# Patient Record
Sex: Female | Born: 1960
Health system: Southern US, Community
[De-identification: ages and names within clinical notes are randomized; demographics above are authoritative.]

## PROBLEM LIST (undated history)

## (undated) DIAGNOSIS — N2889 Other specified disorders of kidney and ureter: Secondary | ICD-10-CM

## (undated) DIAGNOSIS — F32A Depression, unspecified: Secondary | ICD-10-CM

## (undated) DIAGNOSIS — F329 Major depressive disorder, single episode, unspecified: Secondary | ICD-10-CM

## (undated) DIAGNOSIS — K7689 Other specified diseases of liver: Secondary | ICD-10-CM

## (undated) DIAGNOSIS — E669 Obesity, unspecified: Secondary | ICD-10-CM

## (undated) DIAGNOSIS — R911 Solitary pulmonary nodule: Secondary | ICD-10-CM

## (undated) DIAGNOSIS — J45909 Unspecified asthma, uncomplicated: Secondary | ICD-10-CM

## (undated) DIAGNOSIS — E279 Disorder of adrenal gland, unspecified: Secondary | ICD-10-CM

## (undated) DIAGNOSIS — G2581 Restless legs syndrome: Secondary | ICD-10-CM

## (undated) DIAGNOSIS — E041 Nontoxic single thyroid nodule: Secondary | ICD-10-CM

## (undated) DIAGNOSIS — E278 Other specified disorders of adrenal gland: Secondary | ICD-10-CM

## (undated) DIAGNOSIS — I10 Essential (primary) hypertension: Secondary | ICD-10-CM

## (undated) DIAGNOSIS — D649 Anemia, unspecified: Secondary | ICD-10-CM

## (undated) HISTORY — PX: ABDOMINAL HYSTERECTOMY: SHX81

## (undated) HISTORY — PX: TUBAL LIGATION: SHX77

## (undated) HISTORY — PX: NEPHRECTOMY: SHX65

## (undated) HISTORY — PX: TOTAL ABDOMINAL HYSTERECTOMY: SHX209

## (undated) HISTORY — DX: Nontoxic single thyroid nodule: E04.1

## (undated) HISTORY — DX: Other specified diseases of liver: K76.89

---

## 2001-12-18 ENCOUNTER — Ambulatory Visit (HOSPITAL_COMMUNITY): Admission: RE | Admit: 2001-12-18 | Discharge: 2001-12-18 | Payer: Self-pay | Admitting: Orthopaedic Surgery

## 2001-12-18 ENCOUNTER — Encounter: Payer: Self-pay | Admitting: Orthopaedic Surgery

## 2003-03-22 ENCOUNTER — Ambulatory Visit (HOSPITAL_COMMUNITY): Admission: RE | Admit: 2003-03-22 | Discharge: 2003-03-22 | Payer: Self-pay | Admitting: Family Medicine

## 2003-03-22 ENCOUNTER — Encounter: Payer: Self-pay | Admitting: Family Medicine

## 2003-04-11 ENCOUNTER — Ambulatory Visit (HOSPITAL_COMMUNITY): Admission: RE | Admit: 2003-04-11 | Discharge: 2003-04-11 | Payer: Self-pay | Admitting: Family Medicine

## 2003-04-11 ENCOUNTER — Encounter: Payer: Self-pay | Admitting: Family Medicine

## 2012-12-23 DIAGNOSIS — R079 Chest pain, unspecified: Secondary | ICD-10-CM

## 2015-01-11 ENCOUNTER — Emergency Department (HOSPITAL_COMMUNITY): Payer: BLUE CROSS/BLUE SHIELD

## 2015-01-11 ENCOUNTER — Emergency Department (HOSPITAL_COMMUNITY)
Admission: EM | Admit: 2015-01-11 | Discharge: 2015-01-11 | Disposition: A | Discharge status: Home / Self Care | Payer: BLUE CROSS/BLUE SHIELD | Attending: Emergency Medicine | Admitting: Emergency Medicine

## 2015-01-11 ENCOUNTER — Encounter (HOSPITAL_COMMUNITY): Payer: Self-pay | Admitting: Family Medicine

## 2015-01-11 DIAGNOSIS — R42 Dizziness and giddiness: Secondary | ICD-10-CM | POA: Insufficient documentation

## 2015-01-11 DIAGNOSIS — M549 Dorsalgia, unspecified: Secondary | ICD-10-CM | POA: Diagnosis not present

## 2015-01-11 DIAGNOSIS — Z880 Allergy status to penicillin: Secondary | ICD-10-CM | POA: Insufficient documentation

## 2015-01-11 DIAGNOSIS — R200 Anesthesia of skin: Secondary | ICD-10-CM | POA: Diagnosis not present

## 2015-01-11 DIAGNOSIS — R079 Chest pain, unspecified: Secondary | ICD-10-CM | POA: Diagnosis present

## 2015-01-11 DIAGNOSIS — I10 Essential (primary) hypertension: Secondary | ICD-10-CM | POA: Diagnosis not present

## 2015-01-11 DIAGNOSIS — M542 Cervicalgia: Secondary | ICD-10-CM | POA: Insufficient documentation

## 2015-01-11 DIAGNOSIS — R0602 Shortness of breath: Secondary | ICD-10-CM | POA: Diagnosis not present

## 2015-01-11 DIAGNOSIS — Z79899 Other long term (current) drug therapy: Secondary | ICD-10-CM | POA: Diagnosis not present

## 2015-01-11 DIAGNOSIS — R2243 Localized swelling, mass and lump, lower limb, bilateral: Secondary | ICD-10-CM | POA: Diagnosis not present

## 2015-01-11 HISTORY — DX: Essential (primary) hypertension: I10

## 2015-01-11 LAB — BASIC METABOLIC PANEL
Anion gap: 8 (ref 5–15)
BUN: 9 mg/dL (ref 6–20)
CO2: 26 mmol/L (ref 22–32)
Calcium: 9.5 mg/dL (ref 8.9–10.3)
Chloride: 106 mmol/L (ref 101–111)
Creatinine, Ser: 0.63 mg/dL (ref 0.44–1.00)
GFR calc non Af Amer: >60 mL/min (ref >60)
GLUCOSE: 98 mg/dL (ref 65–99)
Potassium: 4.4 mmol/L (ref 3.5–5.1)
SODIUM: 140 mmol/L (ref 135–145)

## 2015-01-11 LAB — CBC
HEMATOCRIT: 42.2 % (ref 36.0–46.0)
Hemoglobin: 13.7 g/dL (ref 12.0–15.0)
MCH: 27.4 pg (ref 26.0–34.0)
MCHC: 32.5 g/dL (ref 30.0–36.0)
MCV: 84.4 fL (ref 78.0–100.0)
Platelets: 307 10*3/uL (ref 150–400)
RBC: 5 MIL/uL (ref 3.87–5.11)
RDW: 14 % (ref 11.5–15.5)
WBC: 9.8 10*3/uL (ref 4.0–10.5)

## 2015-01-11 LAB — I-STAT TROPONIN, ED: Troponin i, poc: 0 ng/mL (ref 0.00–0.08)

## 2015-01-11 LAB — BRAIN NATRIURETIC PEPTIDE: B NATRIURETIC PEPTIDE 5: 7.3 pg/mL (ref 0.0–100.0)

## 2015-01-11 MED ORDER — GI COCKTAIL ~~LOC~~
30.0000 mL | Freq: Once | ORAL | Status: AC
  Administered 2015-01-11: 30 mL via ORAL
  Filled 2015-01-11: qty 30

## 2015-01-11 MED ORDER — NAPROXEN 250 MG PO TABS: 250.0000 mg | ORAL_TABLET | Freq: Two times a day (BID) | ORAL | Status: DC

## 2015-01-11 MED ORDER — OXYCODONE-ACETAMINOPHEN 5-325 MG PO TABS
1.0000 | ORAL_TABLET | Freq: Once | ORAL | Status: AC
  Administered 2015-01-11: 1 via ORAL
  Filled 2015-01-11: qty 1

## 2015-01-11 MED ORDER — OMEPRAZOLE 20 MG PO CPDR: 20.0000 mg | DELAYED_RELEASE_CAPSULE | Freq: Every day | ORAL | Status: DC

## 2015-01-11 MED ORDER — PREDNISONE 10 MG PO TABS: 10.0000 mg | ORAL_TABLET | Freq: Every day | ORAL | Status: DC

## 2015-01-11 NOTE — ED Notes (Addendum)
Pt was admitted to [facility] Monday after a syncopal episode, discharged yesterday. States is still feeling bad. Woke up this am with numbness in both hands, chest and back pain, left sided, radiates to back and neck. 8/10-- dull pain, "hard to explain-- feels like heart is in throat when it's beating"  Pt did have CT at [facility]

## 2015-01-11 NOTE — ED Provider Notes (Signed)
54 year old female, history of hypertension, admitted to the hospital several days ago for syncope, was having chest pain as well, states that she had a normal CT scan of the brain, brings her paperwork with her which shows that she had normal blood work showing no signs of myocardial infarction. 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 I am in agreement with this plan.  ED ECG REPORT  I personally interpreted this EKG   Date: 01/11/2015   Rate: 96  Rhythm: normal sinus rhythm  QRS Axis: normal  Intervals: normal  ST/T Wave abnormalities: normal  Conduction Disutrbances:none  Narrative Interpretation:   Old EKG Reviewed: none available   Medical screening examination/treatment/procedure(s) were conducted as a shared visit with non-physician practitioner(s) and myself.  I personally evaluated the patient during the encounter.  Clinical Impression:   Final diagnoses:  Chest pain, unspecified chest pain type          Noemi Chapel, MD 01/11/15 0375

## 2015-01-11 NOTE — ED Notes (Signed)
PA at bedside.

## 2015-01-11 NOTE — ED Provider Notes (Signed)
CSN: [id number]     Arrival date & time 01/11/15  7867 History   First MD Initiated Contact with Patient 01/11/15 0745     Chief Complaint  Patient presents with  . Chest Pain   Jordan Harmon is a 54 y.o. female with a history of hypertension who presents to the ED complaining of chest pain ongoing for the past week, associated with numbness in her bilateral hands, and left back pain. Patient reports she began having substernal and left-sided chest pain 1 week ago and approximately 4 days ago the patient had a syncopal episode after getting up to use the bathroom. Patient went to [facility] where she was admitted and was discharged yesterday. The patient reports she is feeling the same if not worse from before she was admitted. Patient had a cardiac workup and head CT according to the patient. Patient is discharged yesterday and is continuing to have chest pain. Patient currently complains of left-sided chest pain that radiates into her back and to her left neck that she rates it an 8 out of 10 and describes it as dull. She does report some slight shortness of breath associated with this. She denies worsening chest pain on exertion. She does report some intermittent lightheadedness. The patient reports that after her syncopal episode 4 days ago the patient had numbness in her bilateral hands and feet, and that her feet numbness has resolved. She woke up this morning with her bilateral hands feeling numb but this is slowly improving. Patient also reports lots of burping and belching and symptoms of acid reflux. She does not take treatment for acid reflux.  She has not had another syncope since 4 days ago. The patient denies fevers, chills, headache, dizziness, changes to her vision, wheezing, coughing, palpitations, or rashes. The patient reports her mother had a heart attack in her 51s. The patient denies personal or close family history of DVTs or PEs. Patient denies personal or close family history  of blood clotting disorders such as factor V Leiden, protein C or S deficiency. The patient denies endogenous estrogen use, hemoptysis, smoking, or recent long travel. The patient denies recent surgeries.  (Consider location/radiation/quality/duration/timing/severity/associated sxs/prior Treatment) HPI  Past Medical History  Diagnosis Date  . Hypertension    Past Surgical History  Procedure Laterality Date  . Abdominal hysterectomy     History reviewed. No pertinent family history. History  Substance Use Topics  . Smoking status: Never Smoker   . Smokeless tobacco: Not on file  . Alcohol Use: No   OB History    No data available     Review of Systems  Constitutional: Negative for fever and chills.  HENT: Negative for congestion, ear pain and sore throat.   Eyes: Negative for pain and visual disturbance.  Respiratory: Positive for shortness of breath. Negative for cough and wheezing.   Cardiovascular: Positive for chest pain. Negative for palpitations and leg swelling.  Gastrointestinal: Negative for nausea, vomiting, abdominal pain and diarrhea.  Genitourinary: Negative for dysuria, frequency, hematuria and difficulty urinating.  Musculoskeletal: Positive for back pain and neck pain. Negative for neck stiffness.  Skin: Negative for rash.  Neurological: Positive for light-headedness and numbness. Negative for dizziness, syncope, speech difficulty, weakness and headaches.      Allergies  Penicillins and Asa  Home Medications   Prior to Admission medications   Medication Sig Start Date End Date Taking? Authorizing Provider  amLODipine (NORVASC) 10 MG tablet Take 10 mg by mouth daily.  Yes Historical Provider, MD  naproxen (NAPROSYN) 250 MG tablet Take 1 tablet (250 mg total) by mouth 2 (two) times daily with a meal. 01/11/15   Waynetta Pean, PA-C  omeprazole (PRILOSEC) 20 MG capsule Take 1 capsule (20 mg total) by mouth daily. 01/11/15   Waynetta Pean, PA-C  predniSONE  (DELTASONE) 10 MG tablet Take 1 tablet (10 mg total) by mouth daily. 01/11/15   Waynetta Pean, PA-C   BP 137/81 mmHg  Pulse 78  Temp(Src) 98 F (36.7 C) (Oral)  Resp 16  Ht 5\' 7"  (1.702 m)  Wt 243 lb (110.224 kg)  BMI 38.05 kg/m2  SpO2 100% Physical Exam  Constitutional: She is oriented to person, place, and time. She appears well-developed and well-nourished. No distress.  Nontoxic appearing.  HENT:  Head: Normocephalic and atraumatic.  Right Ear: External ear normal.  Left Ear: External ear normal.  Mouth/Throat: Oropharynx is clear and moist. No oropharyngeal exudate.  Eyes: Conjunctivae and EOM are normal. Pupils are equal, round, and reactive to light. Right eye exhibits no discharge. Left eye exhibits no discharge.  Neck: Normal range of motion. Neck supple. No JVD present. No tracheal deviation present.  No midline neck tenderness. Full range of motion of her neck.  Cardiovascular: Normal rate, regular rhythm, normal heart sounds and intact distal pulses.  Exam reveals no gallop and no friction rub.   No murmur heard. Bilateral radial, posterior tibialis and dorsalis pedis pulses are intact.    Pulmonary/Chest: Effort normal and breath sounds normal. No respiratory distress. She has no wheezes. She has no rales. She exhibits tenderness.  Lungs are clear to auscultation bilaterally. Patient has left sided chest wall tenderness to palpation which reproduces her chest pain.  Abdominal: Soft. Bowel sounds are normal. She exhibits no distension. There is no tenderness. There is no guarding.  Musculoskeletal: She exhibits edema. She exhibits no tenderness.  Mild bilateral ankle edema. No calf edema. There is a small area of ecchymosis to her left lateral low back that is tender to palpation.   Lymphadenopathy:    She has no cervical adenopathy.  Neurological: She is alert and oriented to person, place, and time. No cranial nerve deficit. Coordination normal.  Cranial nerves are  intact. No pronator drift. Finger to nose intact bilaterally. Sensation is intact to her bilateral upper and lower extremities. Patient has 5 out of 5 strength in her bilateral upper and lower extremities.  Skin: Skin is warm and dry. No rash noted. She is not diaphoretic. No erythema. No pallor.  Psychiatric: She has a normal mood and affect. Her behavior is normal.  Nursing note and vitals reviewed.   ED Course  Procedures (including critical care time) Labs Review Labs Reviewed  CBC  BASIC METABOLIC PANEL  BRAIN NATRIURETIC PEPTIDE  Randolm Idol, ED    Imaging Review Dg Chest 2 View  01/11/2015   CLINICAL DATA:  Chest pain.  EXAM: CHEST  2 VIEW  COMPARISON:  December 23, 2012.  FINDINGS: The heart size and mediastinal contours are within normal limits. Both lungs are clear. No pneumothorax or pleural effusion is noted. The visualized skeletal structures are unremarkable.  IMPRESSION: No active cardiopulmonary disease.   Electronically Signed   By: Marijo Conception, M.D.   On: 01/11/2015 09:17     EKG Interpretation None      Filed Vitals:   01/11/15 0930 01/11/15 1015 01/11/15 1035 01/11/15 1050  BP:    137/81  Pulse: 80 66 78  Temp:      TempSrc:      Resp: 26 17 16    Height:      Weight:      SpO2: 100% 99% 100%      MDM   Meds given in ED:  Medications  gi cocktail (Maalox,Lidocaine,Donnatal) (30 mLs Oral Given 01/11/15 0829)  oxyCODONE-acetaminophen (PERCOCET/ROXICET) 5-325 MG per tablet 1 tablet (1 tablet Oral Given 01/11/15 1045)    New Prescriptions   NAPROXEN (NAPROSYN) 250 MG TABLET    Take 1 tablet (250 mg total) by mouth 2 (two) times daily with a meal.   OMEPRAZOLE (PRILOSEC) 20 MG CAPSULE    Take 1 capsule (20 mg total) by mouth daily.   PREDNISONE (DELTASONE) 10 MG TABLET    Take 1 tablet (10 mg total) by mouth daily.    Final diagnoses:  Chest pain, unspecified chest pain type   This is a 54 year old female with a history of hypertension who  resents the emergency department complaining of chest pain ongoing for the past 7 days. Patient had a syncopal episode on Monday and was admitted at [facility] for cardiac workup was discharged yesterday. Patient reports she continues to have this chest pain as well as some tingling and numbness in her bilateral hands. Patient had a cardiac workup and a head CT at this time which was unremarkable. Head CT done 2 days ago was unremarkable. On exam the patient is afebrile and nontoxic appearing. The patient's anterior chest wall is tender to palpation reproduces her chest pain. Her vital signs are stable. Her heart is regular rate and rhythm and EKG shows no acute abnormalities. She has no focal neurological deficits. The patient's troponin is negative. Her BNP is within normal limits. CBC and BMP are within normal limits. Chest x-ray is unremarkable. As the patient has had cp for many days now I see no need for second troponin. Patient is yet to have a cardiac stress test. I called cardiology and had them schedule a cardiac stress test as an outpatient for this patient. Patient is in agreement with following up with cardiology. Will start the patient on omeprazole, naproxen and prednisone 10 mg daily for 10 days. Strict return precautions provided. I advised the patient to follow-up with their primary care provider and cardiology this week. I advised the patient to return to the emergency department with new or worsening symptoms or new concerns. The patient verbalized understanding and agreement with plan.    This patient was discussed with and evaluated by Dr. Sabra Heck who agrees with assessment and plan.    Waynetta Pean, PA-C 01/11/15 1120  Noemi Chapel, MD 01/11/15 [phone]

## 2015-01-11 NOTE — ED Notes (Signed)
Pt ambulated to restroom with Rip Harbour, EMT. Pt states that she became dizzy when returning to room. Will, PA aware.

## 2015-01-11 NOTE — ED Notes (Signed)
Per pt having chest pain, back pain and arm numbness x a few weeks. Seen at more head previously.sts SOB.

## 2015-01-11 NOTE — Discharge Instructions (Signed)
Chest Pain (Nonspecific) °It is often hard to give a specific diagnosis for the cause of chest pain. There is always a chance that your pain could be related to something serious, such as a heart attack or a blood clot in the lungs. You need to follow up with your health care provider for further evaluation. °CAUSES  °· Heartburn. °· Pneumonia or bronchitis. °· Anxiety or stress. °· Inflammation around your heart (pericarditis) or lung (pleuritis or pleurisy). °· A blood clot in the lung. °· A collapsed lung (pneumothorax). It can develop suddenly on its own (spontaneous pneumothorax) or from trauma to the chest. °· Shingles infection (herpes zoster virus). °The chest wall is composed of bones, muscles, and cartilage. Any of these can be the source of the pain. °· The bones can be bruised by injury. °· The muscles or cartilage can be strained by coughing or overwork. °· The cartilage can be affected by inflammation and become sore (costochondritis). °DIAGNOSIS  °Lab tests or other studies may be needed to find the cause of your pain. Your health care provider may have you take a test called an ambulatory electrocardiogram (ECG). An ECG records your heartbeat patterns over a 24-hour period. You may also have other tests, such as: °· Transthoracic echocardiogram (TTE). During echocardiography, sound waves are used to evaluate how blood flows through your heart. °· Transesophageal echocardiogram (TEE). °· Cardiac monitoring. This allows your health care provider to monitor your heart rate and rhythm in real time. °· Holter monitor. This is a portable device that records your heartbeat and can help diagnose heart arrhythmias. It allows your health care provider to track your heart activity for several days, if needed. °· Stress tests by exercise or by giving medicine that makes the heart beat faster. °TREATMENT  °· Treatment depends on what may be causing your chest pain. Treatment may include: °· Acid blockers for  heartburn. °· Anti-inflammatory medicine. °· Pain medicine for inflammatory conditions. °· Antibiotics if an infection is present. °· You may be advised to change lifestyle habits. This includes stopping smoking and avoiding alcohol, caffeine, and chocolate. °· You may be advised to keep your head raised (elevated) when sleeping. This reduces the chance of acid going backward from your stomach into your esophagus. °Most of the time, nonspecific chest pain will improve within 2-3 days with rest and mild pain medicine.  °HOME CARE INSTRUCTIONS  °· If antibiotics were prescribed, take them as directed. Finish them even if you start to feel better. °· For the next few days, avoid physical activities that bring on chest pain. Continue physical activities as directed. °· Do not use any tobacco products, including cigarettes, chewing tobacco, or electronic cigarettes. °· Avoid drinking alcohol. °· Only take medicine as directed by your health care provider. °· Follow your health care provider's suggestions for further testing if your chest pain does not go away. °· Keep any follow-up appointments you made. If you do not go to an appointment, you could develop lasting (chronic) problems with pain. If there is any problem keeping an appointment, call to reschedule. °SEEK MEDICAL CARE IF:  °· Your chest pain does not go away, even after treatment. °· You have a rash with blisters on your chest. °· You have a fever. °SEEK IMMEDIATE MEDICAL CARE IF:  °· You have increased chest pain or pain that spreads to your arm, neck, jaw, back, or abdomen. °· You have shortness of breath. °· You have an increasing cough, or you cough   up blood. °· You have severe back or abdominal pain. °· You feel nauseous or vomit. °· You have severe weakness. °· You faint. °· You have chills. °This is an emergency. Do not wait to see if the pain will go away. Get medical help at once. Call your local emergency services (911 in [place] Do not drive  yourself to the hospital. °MAKE SURE YOU:  °· Understand these instructions. °· Will watch your condition. °· Will get help right away if you are not doing well or get worse. °Document Released: 04/17/2005 Document Revised: 07/13/2013 Document Reviewed: 02/11/2008 °ExitCare® Patient Information ©2015 [employer]. This information is not intended to replace advice given to you by your health care provider. Make sure you discuss any questions you have with your health care provider. °Gastroesophageal Reflux Disease, Adult °Gastroesophageal reflux disease (GERD) happens when acid from your stomach flows up into the esophagus. When acid comes in contact with the esophagus, the acid causes soreness (inflammation) in the esophagus. Over time, GERD may create small holes (ulcers) in the lining of the esophagus. °CAUSES  °· Increased body weight. This puts pressure on the stomach, making acid rise from the stomach into the esophagus. °· Smoking. This increases acid production in the stomach. °· Drinking alcohol. This causes decreased pressure in the lower esophageal sphincter (valve or ring of muscle between the esophagus and stomach), allowing acid from the stomach into the esophagus. °· Late evening meals and a full stomach. This increases pressure and acid production in the stomach. °· A malformed lower esophageal sphincter. °Sometimes, no cause is found. °SYMPTOMS  °· Burning pain in the lower part of the mid-chest behind the breastbone and in the mid-stomach area. This may occur twice a week or more often. °· Trouble swallowing. °· Sore throat. °· Dry cough. °· Asthma-like symptoms including chest tightness, shortness of breath, or wheezing. °DIAGNOSIS  °Your caregiver may be able to diagnose GERD based on your symptoms. In some cases, X-rays and other tests may be done to check for complications or to check the condition of your stomach and esophagus. °TREATMENT  °Your caregiver may recommend over-the-counter or  prescription medicines to help decrease acid production. Ask your caregiver before starting or adding any new medicines.  °HOME CARE INSTRUCTIONS  °· Change the factors that you can control. Ask your caregiver for guidance concerning weight loss, quitting smoking, and alcohol consumption. °· Avoid foods and drinks that make your symptoms worse, such as: °¨ Caffeine or alcoholic drinks. °¨ Chocolate. °¨ Peppermint or mint flavorings. °¨ Garlic and onions. °¨ Spicy foods. °¨ Citrus fruits, such as oranges, lemons, or limes. °¨ Tomato-based foods such as sauce, chili, salsa, and pizza. °¨ Fried and fatty foods. °· Avoid lying down for the 3 hours prior to your bedtime or prior to taking a nap. °· Eat small, frequent meals instead of large meals. °· Wear loose-fitting clothing. Do not wear anything tight around your waist that causes pressure on your stomach. °· Raise the head of your bed 6 to 8 inches with wood blocks to help you sleep. Extra pillows will not help. °· Only take over-the-counter or prescription medicines for pain, discomfort, or fever as directed by your caregiver. °· Do not take aspirin, ibuprofen, or other nonsteroidal anti-inflammatory drugs (NSAIDs). °SEEK IMMEDIATE MEDICAL CARE IF:  °· You have pain in your arms, neck, jaw, teeth, or back. °· Your pain increases or changes in intensity or duration. °· You develop nausea, vomiting, or sweating (diaphoresis). °·   You develop shortness of breath, or you faint. °· Your vomit is green, yellow, black, or looks like coffee grounds or blood. °· Your stool is red, bloody, or black. °These symptoms could be signs of other problems, such as heart disease, gastric bleeding, or esophageal bleeding. °MAKE SURE YOU:  °· Understand these instructions. °· Will watch your condition. °· Will get help right away if you are not doing well or get worse. °Document Released: 04/17/2005 Document Revised: 09/30/2011 Document Reviewed: 01/25/2011 °ExitCare® Patient  Information ©2015 [employer]. This information is not intended to replace advice given to you by your health care provider. Make sure you discuss any questions you have with your health care provider. ° °

## 2015-06-23 ENCOUNTER — Encounter (HOSPITAL_COMMUNITY): Payer: Self-pay | Admitting: Emergency Medicine

## 2015-06-23 ENCOUNTER — Emergency Department (HOSPITAL_COMMUNITY)
Admission: EM | Admit: 2015-06-23 | Discharge: 2015-06-23 | Disposition: A | Discharge status: Home / Self Care | Payer: BLUE CROSS/BLUE SHIELD | Attending: Emergency Medicine | Admitting: Emergency Medicine

## 2015-06-23 ENCOUNTER — Emergency Department (HOSPITAL_BASED_OUTPATIENT_CLINIC_OR_DEPARTMENT_OTHER): Payer: BLUE CROSS/BLUE SHIELD

## 2015-06-23 ENCOUNTER — Emergency Department (HOSPITAL_COMMUNITY): Payer: BLUE CROSS/BLUE SHIELD

## 2015-06-23 DIAGNOSIS — Z791 Long term (current) use of non-steroidal anti-inflammatories (NSAID): Secondary | ICD-10-CM | POA: Diagnosis not present

## 2015-06-23 DIAGNOSIS — I10 Essential (primary) hypertension: Secondary | ICD-10-CM | POA: Diagnosis not present

## 2015-06-23 DIAGNOSIS — M7989 Other specified soft tissue disorders: Secondary | ICD-10-CM

## 2015-06-23 DIAGNOSIS — M545 Low back pain: Secondary | ICD-10-CM | POA: Insufficient documentation

## 2015-06-23 DIAGNOSIS — Z79899 Other long term (current) drug therapy: Secondary | ICD-10-CM | POA: Diagnosis not present

## 2015-06-23 DIAGNOSIS — Z880 Allergy status to penicillin: Secondary | ICD-10-CM | POA: Insufficient documentation

## 2015-06-23 DIAGNOSIS — Z7952 Long term (current) use of systemic steroids: Secondary | ICD-10-CM | POA: Insufficient documentation

## 2015-06-23 DIAGNOSIS — M170 Bilateral primary osteoarthritis of knee: Secondary | ICD-10-CM | POA: Insufficient documentation

## 2015-06-23 DIAGNOSIS — M5431 Sciatica, right side: Secondary | ICD-10-CM | POA: Insufficient documentation

## 2015-06-23 DIAGNOSIS — J45909 Unspecified asthma, uncomplicated: Secondary | ICD-10-CM | POA: Insufficient documentation

## 2015-06-23 DIAGNOSIS — Z87828 Personal history of other (healed) physical injury and trauma: Secondary | ICD-10-CM | POA: Insufficient documentation

## 2015-06-23 DIAGNOSIS — M25562 Pain in left knee: Secondary | ICD-10-CM | POA: Diagnosis present

## 2015-06-23 HISTORY — DX: Unspecified asthma, uncomplicated: J45.909

## 2015-06-23 MED ORDER — TRAMADOL HCL 50 MG PO TABS: 50.0000 mg | ORAL_TABLET | Freq: Four times a day (QID) | ORAL | Status: DC | PRN

## 2015-06-23 MED ORDER — CYCLOBENZAPRINE HCL 10 MG PO TABS: 10.0000 mg | ORAL_TABLET | Freq: Two times a day (BID) | ORAL | Status: DC | PRN

## 2015-06-23 MED ORDER — IBUPROFEN 400 MG PO TABS
800.0000 mg | ORAL_TABLET | Freq: Once | ORAL | Status: AC
  Administered 2015-06-23: 800 mg via ORAL
  Filled 2015-06-23: qty 2

## 2015-06-23 MED ORDER — NAPROXEN 500 MG PO TABS: 500.0000 mg | ORAL_TABLET | Freq: Two times a day (BID) | ORAL | Status: DC

## 2015-06-23 NOTE — ED Provider Notes (Signed)
CSN: [id number]     Arrival date & time 06/23/15  0820 History   None    Chief Complaint  Patient presents with  . Leg Pain     (Consider location/radiation/quality/duration/timing/severity/associated sxs/prior Treatment) HPI  Jordan Harmon is a(n) 54 y.o. female who presents to the ED with cc of BL knee and low back pain.  Sxs began 3 mos ago when she fell at home landing on her back and knocking herself out. She was admitted and discharged from [facility]. The patient states that both knees have been painful. She swelling, feelings of giving way, clicking and catching. She has been using ibuprofen without relief of her symptoms. Patient also complains of severe bilateral leg pain radiating down the back of her legs. She states it is worsened with lying flat and sitting for long periods of time, she has relief with ambulation. She states that occasionally her feet are going to sleep and is extremely painful. She denies a history of previous back pain or injuries. Denies weakness, loss of bowel/bladder function or saddle anesthesia. Denies neck stiffness, headache, rash.  Denies fever or recent procedures to back.   Past Medical History  Diagnosis Date  . Hypertension   . Asthma    Past Surgical History  Procedure Laterality Date  . Abdominal hysterectomy     No family history on file. Social History  Substance Use Topics  . Smoking status: Never Smoker   . Smokeless tobacco: None  . Alcohol Use: No   OB History    No data available     Review of Systems  Ten systems reviewed and are negative for acute change, except as noted in the HPI.    Allergies  Penicillins and Asa  Home Medications   Prior to Admission medications   Medication Sig Start Date End Date Taking? Authorizing Provider  amLODipine (NORVASC) 10 MG tablet Take 10 mg by mouth daily.   Yes Historical Provider, MD  naproxen (NAPROSYN) 250 MG tablet Take 1 tablet (250 mg total) by mouth 2 (two) times daily  with a meal. 01/11/15  Yes Waynetta Pean, PA-C  omeprazole (PRILOSEC) 20 MG capsule Take 1 capsule (20 mg total) by mouth daily. 01/11/15  Yes Waynetta Pean, PA-C  predniSONE (DELTASONE) 10 MG tablet Take 1 tablet (10 mg total) by mouth daily. 01/11/15   Waynetta Pean, PA-C   BP 131/69 mmHg  Pulse 90  Temp(Src) 98.1 F (36.7 C) (Oral)  Resp 18  SpO2 99% Physical Exam Physical Exam  Constitutional: Pt appears well-developed and well-nourished. No distress.  HENT:  Head: Normocephalic and atraumatic.  Mouth/Throat: Oropharynx is clear and moist. No oropharyngeal exudate.  Eyes: Conjunctivae are normal.  Neck: Normal range of motion. Neck supple.  Full ROM without pain  Cardiovascular: Normal rate, regular rhythm and intact distal pulses.   Pulmonary/Chest: Effort normal and breath sounds normal. No respiratory distress. Pt has no wheezes.  Abdominal: Soft. Pt exhibits no distension. There is no tenderness.  Musculoskeletal:  Full range of motion of the T-spine and L-spine No tenderness to palpation of the spinous processes of the T-spine or L-spine + Straight leg test R Lymphadenopathy:    Pt has no cervical adenopathy.  Neurological: Pt is alert. Pt has normal reflexes.  Reflex Scores:      Bicep reflexes are 2+ on the right side and 2+ on the left side.      Brachioradialis reflexes are 2+ on the right side and 2+ on the  left side.      Patellar reflexes are 2+ on the right side and 2+ on the left side.      Achilles reflexes are 2+ on the right side and 2+ on the left side. No weakness. Left calf and popliteal pain. Mild crepitus with flexion extension. No ul swelling. DP/PT pulses intact. BL knees with joint line tenderness Speech is clear and goal oriented, follows commands Normal 5/5 strength in upper and lower extremities bilaterally including dorsiflexion and plantar flexion, strong and equal grip strength Sensation normal to light and sharp touch Moves extremities  without ataxia, coordination intact Normal gait Normal balance No Clonus   Skin: Skin is warm and dry. No rash noted. Pt is not diaphoretic. No erythema.  Psychiatric: Pt has a normal mood and affect. Behavior is normal.  Nursing note and vitals reviewed.   ED Course  Procedures (including critical care time) Labs Review Labs Reviewed - No data to display  Imaging Review No results found. I have personally reviewed and evaluated these images and lab results as part of my medical decision-making.   EKG Interpretation None      MDM   Final diagnoses:  Primary osteoarthritis of both knees  Sciatica of right side    10:07 AM BP 131/69 mmHg  Pulse 90  Temp(Src) 98.1 F (36.7 C) (Oral)  Resp 18  SpO2 99% pATIENT WITH OA, negative for DVT study. +sciatica. Without red flag sxs. Will dc with follow up to pcp and Ortho,. Discussed return precautions and appears safe for discharge at this time    Margarita Mail, PA-C 06/23/15 [place], MD 06/26/15 [phone]

## 2015-06-23 NOTE — ED Notes (Signed)
Patient states fell x 3 months ago and since that time she has pain from hip to toes.   Patient states knees feel like they give out at times.    Patient states takes aleve at home but doesn't help.

## 2015-06-23 NOTE — Progress Notes (Signed)
Preliminary results by tech - Left Lower ext.Venous duplex completed. Negative for deep and superficial vein thrombosis in the left leg. Results given to patient's nurse. Oda Cogan, BS, RDMS, RVT

## 2015-06-23 NOTE — Discharge Instructions (Signed)
Osteoarthritis Osteoarthritis is a disease that causes soreness and inflammation of a joint. It occurs when the cartilage at the affected joint wears down. Cartilage acts as a cushion, covering the ends of bones where they meet to form a joint. Osteoarthritis is the most common form of arthritis. It often occurs in older people. The joints affected most often by this condition include those in the:  Ends of the fingers.  Thumbs.  Neck.  Lower back.  Knees.  Hips. CAUSES  Over time, the cartilage that covers the ends of bones begins to wear away. This causes bone to rub on bone, producing pain and stiffness in the affected joints.  RISK FACTORS Certain factors can increase your chances of having osteoarthritis, including:  Older age.  Excessive body weight.  Overuse of joints.  Previous joint injury. SIGNS AND SYMPTOMS   Pain, swelling, and stiffness in the joint.  Over time, the joint may lose its normal shape.  Small deposits of bone (osteophytes) may grow on the edges of the joint.  Bits of bone or cartilage can break off and float inside the joint space. This may cause more pain and damage. DIAGNOSIS  Your health care provider will do a physical exam and ask about your symptoms. Various tests may be ordered, such as:  X-rays of the affected joint.  Blood tests to rule out other types of arthritis. Additional tests may be used to diagnose your condition. TREATMENT  Goals of treatment are to control pain and improve joint function. Treatment plans may include:  A prescribed exercise program that allows for rest and joint relief.  A weight control plan.  Pain relief techniques, such as:  Properly applied heat and cold.  Electric pulses delivered to nerve endings under the skin (transcutaneous electrical nerve stimulation [TENS]).  Massage.  Certain nutritional supplements.  Medicines to control pain, such as:  Acetaminophen.  Nonsteroidal  anti-inflammatory drugs (NSAIDs), such as naproxen.  Narcotic or central-acting agents, such as tramadol.  Corticosteroids. These can be given orally or as an injection.  Surgery to reposition the bones and relieve pain (osteotomy) or to remove loose pieces of bone and cartilage. Joint replacement may be needed in advanced states of osteoarthritis. HOME CARE INSTRUCTIONS   Take medicines only as directed by your health care provider.  Maintain a healthy weight. Follow your health care provider's instructions for weight control. This may include dietary instructions.  Exercise as directed. Your health care provider can recommend specific types of exercise. These may include:  Strengthening exercises. These are done to strengthen the muscles that support joints affected by arthritis. They can be performed with weights or with exercise bands to add resistance.  Aerobic activities. These are exercises, such as brisk walking or low-impact aerobics, that get your heart pumping.  Range-of-motion activities. These keep your joints limber.  Balance and agility exercises. These help you maintain daily living skills.  Rest your affected joints as directed by your health care provider.  Keep all follow-up visits as directed by your health care provider. SEEK MEDICAL CARE IF:   Your skin turns red.  You develop a rash in addition to your joint pain.  You have worsening joint pain.  You have a fever along with joint or muscle aches. SEEK IMMEDIATE MEDICAL CARE IF:  You have a significant loss of weight or appetite.  You have night sweats. [place] of Arthritis and Musculoskeletal and Skin Diseases: www.niams.SouthExposed.es  National Institute on  Aging: http://kim-miller.com/  American College of Rheumatology: www.rheumatology.org   This information is not intended to replace advice given to you by your health care provider. Make sure you discuss any questions you  have with your health care provider.   Document Released: 07/08/2005 Document Revised: 07/29/2014 Document Reviewed: 03/15/2013 Elsevier Interactive Patient Education 2016 Elsevier Inc.  Sciatica Sciatica is pain, weakness, numbness, or tingling along the path of the sciatic nerve. The nerve starts in the lower back and runs down the back of each leg. The nerve controls the muscles in the lower leg and in the back of the knee, while also providing sensation to the back of the thigh, lower leg, and the sole of your foot. Sciatica is a symptom of another medical condition. For instance, nerve damage or certain conditions, such as a herniated disk or bone spur on the spine, pinch or put pressure on the sciatic nerve. This causes the pain, weakness, or other sensations normally associated with sciatica. Generally, sciatica only affects one side of the body. CAUSES   Herniated or slipped disc.  Degenerative disk disease.  A pain disorder involving the narrow muscle in the buttocks (piriformis syndrome).  Pelvic injury or fracture.  Pregnancy.  Tumor (rare). SYMPTOMS  Symptoms can vary from mild to very severe. The symptoms usually travel from the low back to the buttocks and down the back of the leg. Symptoms can include:  Mild tingling or dull aches in the lower back, leg, or hip.  Numbness in the back of the calf or sole of the foot.  Burning sensations in the lower back, leg, or hip.  Sharp pains in the lower back, leg, or hip.  Leg weakness.  Severe back pain inhibiting movement. These symptoms may get worse with coughing, sneezing, laughing, or prolonged sitting or standing. Also, being overweight may worsen symptoms. DIAGNOSIS  Your caregiver will perform a physical exam to look for common symptoms of sciatica. He or she may ask you to do certain movements or activities that would trigger sciatic nerve pain. Other tests may be performed to find the cause of the sciatica. These  may include:  Blood tests.  X-rays.  Imaging tests, such as an MRI or CT scan. TREATMENT  Treatment is directed at the cause of the sciatic pain. Sometimes, treatment is not necessary and the pain and discomfort goes away on its own. If treatment is needed, your caregiver may suggest:  Over-the-counter medicines to relieve pain.  Prescription medicines, such as anti-inflammatory medicine, muscle relaxants, or narcotics.  Applying heat or ice to the painful area.  Steroid injections to lessen pain, irritation, and inflammation around the nerve.  Reducing activity during periods of pain.  Exercising and stretching to strengthen your abdomen and improve flexibility of your spine. Your caregiver may suggest losing weight if the extra weight makes the back pain worse.  Physical therapy.  Surgery to eliminate what is pressing or pinching the nerve, such as a bone spur or part of a herniated disk. HOME CARE INSTRUCTIONS   Only take over-the-counter or prescription medicines for pain or discomfort as directed by your caregiver.  Apply ice to the affected area for 20 minutes, 3-4 times a day for the first 48-72 hours. Then try heat in the same way.  Exercise, stretch, or perform your usual activities if these do not aggravate your pain.  Attend physical therapy sessions as directed by your caregiver.  Keep all follow-up appointments as directed by your caregiver.  Do  not wear high heels or shoes that do not provide proper support.  Check your mattress to see if it is too soft. A firm mattress may lessen your pain and discomfort. SEEK IMMEDIATE MEDICAL CARE IF:   You lose control of your bowel or bladder (incontinence).  You have increasing weakness in the lower back, pelvis, buttocks, or legs.  You have redness or swelling of your back.  You have a burning sensation when you urinate.  You have pain that gets worse when you lie down or awakens you at night.  Your pain is  worse than you have experienced in the past.  Your pain is lasting longer than 4 weeks.  You are suddenly losing weight without reason. MAKE SURE YOU:  Understand these instructions.  Will watch your condition.  Will get help right away if you are not doing well or get worse.   This information is not intended to replace advice given to you by your health care provider. Make sure you discuss any questions you have with your health care provider.   Document Released: 07/02/2001 Document Revised: 03/29/2015 Document Reviewed: 11/17/2011 Elsevier Interactive Patient Education [employer].

## 2015-06-23 NOTE — ED Notes (Signed)
See PA assessment 

## 2016-05-13 ENCOUNTER — Ambulatory Visit: Payer: Self-pay | Admitting: Physician Assistant

## 2016-05-23 ENCOUNTER — Ambulatory Visit (INDEPENDENT_AMBULATORY_CARE_PROVIDER_SITE_OTHER): Payer: BLUE CROSS/BLUE SHIELD | Admitting: Physician Assistant

## 2016-05-23 ENCOUNTER — Encounter (INDEPENDENT_AMBULATORY_CARE_PROVIDER_SITE_OTHER): Payer: Self-pay

## 2016-05-23 ENCOUNTER — Encounter: Payer: Self-pay | Admitting: Physician Assistant

## 2016-05-23 VITALS — BP 135/88 | HR 88 | Temp 97.3°F | Ht 67.0 in | Wt 227.0 lb

## 2016-05-23 DIAGNOSIS — R208 Other disturbances of skin sensation: Secondary | ICD-10-CM | POA: Diagnosis not present

## 2016-05-23 DIAGNOSIS — I10 Essential (primary) hypertension: Secondary | ICD-10-CM

## 2016-05-23 DIAGNOSIS — R200 Anesthesia of skin: Secondary | ICD-10-CM

## 2016-05-23 DIAGNOSIS — M21612 Bunion of left foot: Secondary | ICD-10-CM

## 2016-05-23 DIAGNOSIS — F320 Major depressive disorder, single episode, mild: Secondary | ICD-10-CM

## 2016-05-23 MED ORDER — LISINOPRIL-HYDROCHLOROTHIAZIDE 20-25 MG PO TABS: 1.0000 | ORAL_TABLET | Freq: Every day | ORAL | 11 refills | Status: DC

## 2016-05-23 MED ORDER — AMLODIPINE BESYLATE 10 MG PO TABS: 10.0000 mg | ORAL_TABLET | Freq: Every day | ORAL | 11 refills | Status: DC

## 2016-05-23 MED ORDER — TRAZODONE HCL 50 MG PO TABS: 50.0000 mg | ORAL_TABLET | Freq: Every evening | ORAL | 6 refills | Status: DC | PRN

## 2016-05-23 NOTE — Patient Instructions (Signed)
Morton Neuralgia  Morton neuralgia is a type of foot pain in the area closest to your toes. This area is sometimes called the ball of your foot. Morton neuralgia occurs when a branch of a nerve in your foot (digital nerve) becomes compressed.   When this happens over a long period of time, the nerve can thicken (neuroma) and cause pain. This usually occurs between the third and fourth toe. Morton neuralgia can come and go but may get worse over time.   CAUSES  Your digital nerve can become compressed and stretched at a point where it passes under a thick band of tissue that connects your toes (intermetatarsal ligament). Morton neuralgia can be caused by mild repetitive damage in this area. This type of damage can result from:   · Activities such as running or jumping.  · Wearing shoes that are too tight.  RISK FACTORS  You may be at risk for Morton neuralgia if you:  · Are female.  · Wear high heels.  · Wear shoes that are narrow or tight.  · Participate in activities that stretch your toes. These include:  ¨ Running.  ¨ Ballet.  ¨ Long-distance walking.  SIGNS AND SYMPTOMS  The first symptom of Morton neuralgia is pain that spreads from the ball of your foot to your toes. It may feel like you are walking on a marble. Pain usually gets worse with walking and goes away at night. Other symptoms may include numbness and cramping of your toes.  DIAGNOSIS   Your health care provider will do a physical exam. When doing the exam, your health care provider may:   · Squeeze your foot just behind your toe.  · Ask you to move your toes to check for pain.  You may also have tests on your foot to confirm the diagnosis. These may include:   · An X-ray.  · An MRI.  TREATMENT   Treatment for Morton neuralgia may be as simple as changing the kind of shoes you wear. Other treatments may include:  · Wearing a supportive pad (orthosis) under the front of your foot. This lifts your toe bones and takes pressure off the nerve.  · Getting  injections of numbing medicine and anti-inflammatory medicine (steroid) in the nerve.  · Having surgery to remove part of the thickened nerve.  HOME CARE INSTRUCTIONS   · Take medicine only as directed by your health care provider.  · Wear soft-soled shoes with a wide toe area.  · Stop activities that may be causing pain.  · Elevate your foot when resting.  · Massage your foot.  · Apply ice to the injured area:      Put ice in a plastic bag.    Place a towel between your skin and the bag.    Leave the ice on for 20 minutes, 2-3 times a day.    · Keep all follow-up visits as directed by your health care provider. This is important.  SEEK MEDICAL CARE IF:  · Home care instructions are not helping you get better.  · Your symptoms change or get worse.     This information is not intended to replace advice given to you by your health care provider. Make sure you discuss any questions you have with your health care provider.     Document Released: 10/14/2000 Document Revised: 07/29/2014 Document Reviewed: 09/08/2013  Elsevier Interactive Patient Education ©2016 Elsevier Inc.

## 2016-05-23 NOTE — Progress Notes (Signed)
BP 135/88   Pulse 88   Temp 97.3 F (36.3 C) (Oral)   Ht 5\' 7"  (1.702 m)   Wt 227 lb (103 kg)   BMI 35.55 kg/m    Subjective:    Patient ID: Jordan Harmon, female    DOB: 1961-02-18, 55 y.o.   MRN: [id number]  Jordan Harmon is a 54 y.o. female presenting on 05/23/2016 for Medication Refill and Numbness (Numbness on left side for hands down to toes )  HPI Patient here to be established as new patient at [place].  This patient is known to me from [facility]. Pain in the midfoot to lateral portion. Has a very large bunion for some time and the foot has begun to hurt more. She has begun experiencing numbness now and was greatly concerned about that. She stands for her work. History is positive for HTN, asthma, anemia.  Past Medical History:  Diagnosis Date  . Asthma   . Hypertension    Relevant past medical, surgical, family and social history reviewed and updated as indicated. Interim medical history since our last visit reviewed. Allergies and medications reviewed and updated.   Data reviewed from any sources in EPIC.  Review of Systems  Constitutional: Negative for activity change, fatigue and fever.  HENT: Negative.   Eyes: Negative.   Respiratory: Negative.  Negative for cough.   Cardiovascular: Negative.  Negative for chest pain.  Gastrointestinal: Negative.  Negative for abdominal pain.  Endocrine: Negative.   Genitourinary: Negative.  Negative for dysuria.  Musculoskeletal: Positive for arthralgias and joint swelling.  Skin: Negative.   Neurological: Positive for weakness and numbness.     Social History   Social History  . Marital status: Widowed    Spouse name: N/A  . Number of children: N/A  . Years of education: N/A   Occupational History  . Not on file.   Social History Main Topics  . Smoking status: Never Smoker  . Smokeless tobacco: Never Used  . Alcohol use No  . Drug use: No  . Sexual activity: Not on  file   Other Topics Concern  . Not on file   Social History Narrative  . No narrative on file    Past Surgical History:  Procedure Laterality Date  . ABDOMINAL HYSTERECTOMY      Family History  Problem Relation Age of Onset  . Diabetes Mother   . Heart disease Mother       Medication List       Accurate as of 05/23/16  2:41 PM. Always use your most recent med list.          amLODipine 10 MG tablet Commonly known as:  NORVASC Take 1 tablet (10 mg total) by mouth daily.   lisinopril-hydrochlorothiazide 20-25 MG tablet Commonly known as:  PRINZIDE,ZESTORETIC Take 1 tablet by mouth daily.   traZODone 50 MG tablet Commonly known as:  DESYREL Take 1-2 tablets (50-100 mg total) by mouth at bedtime as needed for sleep.          Objective:    BP 135/88   Pulse 88   Temp 97.3 F (36.3 C) (Oral)   Ht 5\' 7"  (1.702 m)   Wt 227 lb (103 kg)   BMI 35.55 kg/m   Allergies  Allergen Reactions  . Penicillins Anaphylaxis  . Asa [Aspirin] Hives   Wt Readings from Last 3 Encounters:  05/23/16 227 lb (103 kg)  01/11/15 243 lb (110.2 kg)  Physical Exam  Constitutional: She is oriented to person, place, and time. She appears well-developed and well-nourished.  HENT:  Head: Normocephalic and atraumatic.  Eyes: Conjunctivae and EOM are normal. Pupils are equal, round, and reactive to light.  Cardiovascular: Normal rate, regular rhythm, normal heart sounds and intact distal pulses.   Pulmonary/Chest: Effort normal and breath sounds normal.  Abdominal: Soft. Bowel sounds are normal.  Musculoskeletal:       Left foot: There is decreased range of motion, tenderness, bony tenderness, swelling and deformity. There is normal capillary refill.       Feet:  Loss of sensitivity to monofilament on plantar surface.  Neurological: She is alert and oriented to person, place, and time. She has normal reflexes.  Skin: Skin is warm and dry. No rash noted.  Psychiatric: She has a  normal mood and affect. Her behavior is normal. Judgment and thought content normal.        Assessment & Plan:   1. Essential hypertension - lisinopril-hydrochlorothiazide (PRINZIDE,ZESTORETIC) 20-25 MG tablet; Take 1 tablet by mouth daily.  Dispense: 90 tablet; Refill: 11 - amLODipine (NORVASC) 10 MG tablet; Take 1 tablet (10 mg total) by mouth daily.  Dispense: 90 tablet; Refill: 11  2. Bunion of great toe of left foot - Ambulatory referral to Podiatry  3. Numbness of left foot - Ambulatory referral to Podiatry  4. Mild single current episode of major depressive disorder (HCC) - traZODone (DESYREL) 50 MG tablet; Take 1-2 tablets (50-100 mg total) by mouth at bedtime as needed for sleep.  Dispense: 60 tablet; Refill: 6   Continue all other maintenance medications as listed above. Educational handout given for morton's neuroma  Follow up plan: Return in about 6 months (around 11/20/2016).  Terald Sleeper PA-C [facility] [address]  [place], [place] [address] [phone]   05/23/2016, 2:41 PM

## 2016-07-27 ENCOUNTER — Observation Stay (HOSPITAL_COMMUNITY): Payer: BLUE CROSS/BLUE SHIELD

## 2016-07-27 ENCOUNTER — Encounter (HOSPITAL_COMMUNITY): Payer: Self-pay | Admitting: Emergency Medicine

## 2016-07-27 ENCOUNTER — Emergency Department (HOSPITAL_COMMUNITY): Payer: BLUE CROSS/BLUE SHIELD

## 2016-07-27 ENCOUNTER — Observation Stay (HOSPITAL_COMMUNITY)
Admission: EM | Admit: 2016-07-27 | Discharge: 2016-07-28 | Disposition: A | Discharge status: Home / Self Care | Payer: BLUE CROSS/BLUE SHIELD | Attending: Internal Medicine | Admitting: Internal Medicine

## 2016-07-27 DIAGNOSIS — E669 Obesity, unspecified: Secondary | ICD-10-CM | POA: Diagnosis not present

## 2016-07-27 DIAGNOSIS — E279 Disorder of adrenal gland, unspecified: Secondary | ICD-10-CM

## 2016-07-27 DIAGNOSIS — D72829 Elevated white blood cell count, unspecified: Secondary | ICD-10-CM | POA: Diagnosis present

## 2016-07-27 DIAGNOSIS — R911 Solitary pulmonary nodule: Secondary | ICD-10-CM | POA: Diagnosis not present

## 2016-07-27 DIAGNOSIS — A084 Viral intestinal infection, unspecified: Principal | ICD-10-CM | POA: Insufficient documentation

## 2016-07-27 DIAGNOSIS — Z6833 Body mass index (BMI) 33.0-33.9, adult: Secondary | ICD-10-CM | POA: Insufficient documentation

## 2016-07-27 DIAGNOSIS — G2581 Restless legs syndrome: Secondary | ICD-10-CM | POA: Diagnosis not present

## 2016-07-27 DIAGNOSIS — R109 Unspecified abdominal pain: Secondary | ICD-10-CM | POA: Diagnosis not present

## 2016-07-27 DIAGNOSIS — R1084 Generalized abdominal pain: Secondary | ICD-10-CM

## 2016-07-27 DIAGNOSIS — N2889 Other specified disorders of kidney and ureter: Secondary | ICD-10-CM | POA: Diagnosis not present

## 2016-07-27 DIAGNOSIS — I10 Essential (primary) hypertension: Secondary | ICD-10-CM | POA: Insufficient documentation

## 2016-07-27 DIAGNOSIS — E278 Other specified disorders of adrenal gland: Secondary | ICD-10-CM | POA: Diagnosis present

## 2016-07-27 DIAGNOSIS — R110 Nausea: Secondary | ICD-10-CM

## 2016-07-27 DIAGNOSIS — R7989 Other specified abnormal findings of blood chemistry: Secondary | ICD-10-CM | POA: Diagnosis present

## 2016-07-27 DIAGNOSIS — R112 Nausea with vomiting, unspecified: Secondary | ICD-10-CM | POA: Diagnosis present

## 2016-07-27 HISTORY — DX: Other specified disorders of adrenal gland: E27.8

## 2016-07-27 HISTORY — DX: Anemia, unspecified: D64.9

## 2016-07-27 HISTORY — DX: Disorder of adrenal gland, unspecified: E27.9

## 2016-07-27 HISTORY — DX: Solitary pulmonary nodule: R91.1

## 2016-07-27 HISTORY — DX: Depression, unspecified: F32.A

## 2016-07-27 HISTORY — DX: Major depressive disorder, single episode, unspecified: F32.9

## 2016-07-27 HISTORY — DX: Restless legs syndrome: G25.81

## 2016-07-27 HISTORY — DX: Obesity, unspecified: E66.9

## 2016-07-27 HISTORY — DX: Other specified disorders of kidney and ureter: N28.89

## 2016-07-27 LAB — URINALYSIS, ROUTINE W REFLEX MICROSCOPIC
Bilirubin Urine: NEGATIVE
GLUCOSE, UA: NEGATIVE mg/dL
HGB URINE DIPSTICK: NEGATIVE
Ketones, ur: 5 mg/dL — AB
Leukocytes, UA: NEGATIVE
Nitrite: NEGATIVE
PH: 6 (ref 5.0–8.0)
PROTEIN: NEGATIVE mg/dL
Specific Gravity, Urine: >1.046 — ABNORMAL HIGH (ref 1.005–1.030)

## 2016-07-27 LAB — COMPREHENSIVE METABOLIC PANEL
ALK PHOS: 74 U/L (ref 38–126)
ALT: 28 U/L (ref 14–54)
AST: 32 U/L (ref 15–41)
Albumin: 4.1 g/dL (ref 3.5–5.0)
Anion gap: 13 (ref 5–15)
BUN: 23 mg/dL — AB (ref 6–20)
CALCIUM: 10 mg/dL (ref 8.9–10.3)
CO2: 22 mmol/L (ref 22–32)
CREATININE: 0.98 mg/dL (ref 0.44–1.00)
Chloride: 103 mmol/L (ref 101–111)
GFR calc Af Amer: >60 mL/min (ref >60)
GLUCOSE: 118 mg/dL — AB (ref 65–99)
Potassium: 3.9 mmol/L (ref 3.5–5.1)
Sodium: 138 mmol/L (ref 135–145)
TOTAL PROTEIN: 8.5 g/dL — AB (ref 6.5–8.1)
Total Bilirubin: 0.7 mg/dL (ref 0.3–1.2)

## 2016-07-27 LAB — CBC WITH DIFFERENTIAL/PLATELET
Basophils Absolute: 0 10*3/uL (ref 0.0–0.1)
Basophils Relative: 0 %
Eosinophils Absolute: 0.2 10*3/uL (ref 0.0–0.7)
Eosinophils Relative: 1 %
HCT: 43.8 % (ref 36.0–46.0)
Hemoglobin: 14.9 g/dL (ref 12.0–15.0)
LYMPHS PCT: 10 %
Lymphs Abs: 2 10*3/uL (ref 0.7–4.0)
MCH: 28.3 pg (ref 26.0–34.0)
MCHC: 34 g/dL (ref 30.0–36.0)
MCV: 83.1 fL (ref 78.0–100.0)
MONO ABS: 1 10*3/uL (ref 0.1–1.0)
Monocytes Relative: 5 %
NEUTROS ABS: 17.7 10*3/uL — AB (ref 1.7–7.7)
Neutrophils Relative %: 84 %
Platelets: 388 10*3/uL (ref 150–400)
RBC: 5.27 MIL/uL — AB (ref 3.87–5.11)
RDW: 14.2 % (ref 11.5–15.5)
WBC: 20.9 10*3/uL — ABNORMAL HIGH (ref 4.0–10.5)

## 2016-07-27 LAB — I-STAT TROPONIN, ED: TROPONIN I, POC: 0 ng/mL (ref 0.00–0.08)

## 2016-07-27 LAB — CBC
HCT: 36.9 % (ref 36.0–46.0)
HEMOGLOBIN: 12.4 g/dL (ref 12.0–15.0)
MCH: 27.9 pg (ref 26.0–34.0)
MCHC: 33.6 g/dL (ref 30.0–36.0)
MCV: 82.9 fL (ref 78.0–100.0)
Platelets: 300 10*3/uL (ref 150–400)
RBC: 4.45 MIL/uL (ref 3.87–5.11)
RDW: 14.1 % (ref 11.5–15.5)
WBC: 10.8 10*3/uL — ABNORMAL HIGH (ref 4.0–10.5)

## 2016-07-27 LAB — FERRITIN: Ferritin: 57 ng/mL (ref 11–307)

## 2016-07-27 LAB — I-STAT CG4 LACTIC ACID, ED
LACTIC ACID, VENOUS: 0.79 mmol/L (ref 0.5–1.9)
Lactic Acid, Venous: 4.06 mmol/L (ref 0.5–1.9)

## 2016-07-27 LAB — LIPASE, BLOOD: Lipase: 29 U/L (ref 11–51)

## 2016-07-27 LAB — INFLUENZA PANEL BY PCR (TYPE A & B)
INFLAPCR: NEGATIVE
Influenza B By PCR: NEGATIVE

## 2016-07-27 LAB — POC OCCULT BLOOD, ED: Fecal Occult Bld: NEGATIVE

## 2016-07-27 MED ORDER — AMLODIPINE BESYLATE 10 MG PO TABS
10.0000 mg | ORAL_TABLET | Freq: Every day | ORAL | Status: DC
  Administered 2016-07-27: 10 mg via ORAL
  Filled 2016-07-27: qty 2

## 2016-07-27 MED ORDER — ONDANSETRON HCL 4 MG/2ML IJ SOLN
4.0000 mg | INTRAMUSCULAR | Status: AC
  Administered 2016-07-27: 4 mg via INTRAVENOUS
  Filled 2016-07-27: qty 2

## 2016-07-27 MED ORDER — METHOCARBAMOL 500 MG PO TABS: 500.0000 mg | ORAL_TABLET | Freq: Three times a day (TID) | ORAL | Status: DC | PRN

## 2016-07-27 MED ORDER — SODIUM CHLORIDE 0.9 % IV BOLUS (SEPSIS): 1000.0000 mL | Freq: Once | INTRAVENOUS | Status: DC

## 2016-07-27 MED ORDER — SODIUM CHLORIDE 0.9 % IV BOLUS (SEPSIS)
1000.0000 mL | Freq: Once | INTRAVENOUS | Status: AC
  Administered 2016-07-27: 1000 mL via INTRAVENOUS

## 2016-07-27 MED ORDER — IOPAMIDOL (ISOVUE-370) INJECTION 76%
INTRAVENOUS | Status: AC
  Administered 2016-07-27: 100 mL
  Filled 2016-07-27: qty 100

## 2016-07-27 MED ORDER — ACETAMINOPHEN 650 MG RE SUPP: 650.0000 mg | Freq: Four times a day (QID) | RECTAL | Status: DC | PRN

## 2016-07-27 MED ORDER — ENOXAPARIN SODIUM 60 MG/0.6ML ~~LOC~~ SOLN
0.5000 mg/kg | SUBCUTANEOUS | Status: DC
  Filled 2016-07-27: qty 0.6

## 2016-07-27 MED ORDER — HYDROMORPHONE HCL 2 MG/ML IJ SOLN
1.0000 mg | Freq: Once | INTRAMUSCULAR | Status: AC
  Administered 2016-07-27: 1 mg via INTRAVENOUS
  Filled 2016-07-27: qty 1

## 2016-07-27 MED ORDER — HYDRALAZINE HCL 20 MG/ML IJ SOLN: 5.0000 mg | INTRAMUSCULAR | Status: DC | PRN

## 2016-07-27 MED ORDER — ONDANSETRON HCL 4 MG PO TABS: 4.0000 mg | ORAL_TABLET | Freq: Four times a day (QID) | ORAL | Status: DC | PRN

## 2016-07-27 MED ORDER — HYDROCODONE-ACETAMINOPHEN 5-325 MG PO TABS: 1.0000 | ORAL_TABLET | ORAL | Status: DC | PRN

## 2016-07-27 MED ORDER — ACETAMINOPHEN 325 MG PO TABS: 650.0000 mg | ORAL_TABLET | Freq: Four times a day (QID) | ORAL | Status: DC | PRN

## 2016-07-27 MED ORDER — LISINOPRIL-HYDROCHLOROTHIAZIDE 20-25 MG PO TABS: 1.0000 | ORAL_TABLET | Freq: Every day | ORAL | Status: DC

## 2016-07-27 MED ORDER — HYDROCHLOROTHIAZIDE 25 MG PO TABS: 25.0000 mg | ORAL_TABLET | Freq: Every day | ORAL | Status: DC

## 2016-07-27 MED ORDER — ENSURE ENLIVE PO LIQD
237.0000 mL | Freq: Two times a day (BID) | ORAL | Status: DC
  Administered 2016-07-28: 237 mL via ORAL

## 2016-07-27 MED ORDER — ENOXAPARIN SODIUM 60 MG/0.6ML ~~LOC~~ SOLN
0.5000 mg/kg | SUBCUTANEOUS | Status: DC
  Administered 2016-07-27: 50 mg via SUBCUTANEOUS
  Filled 2016-07-27: qty 0.6

## 2016-07-27 MED ORDER — LISINOPRIL 20 MG PO TABS: 20.0000 mg | ORAL_TABLET | Freq: Every day | ORAL | Status: DC

## 2016-07-27 MED ORDER — ONDANSETRON HCL 4 MG/2ML IJ SOLN
4.0000 mg | Freq: Four times a day (QID) | INTRAMUSCULAR | Status: DC | PRN
  Filled 2016-07-27: qty 2

## 2016-07-27 MED ORDER — FENTANYL CITRATE (PF) 100 MCG/2ML IJ SOLN
50.0000 ug | Freq: Once | INTRAMUSCULAR | Status: AC
  Administered 2016-07-27: 50 ug via INTRAVENOUS
  Filled 2016-07-27: qty 2

## 2016-07-27 MED ORDER — SODIUM CHLORIDE 0.9 % IV BOLUS (SEPSIS)
1000.0000 mL | INTRAVENOUS | Status: AC
  Administered 2016-07-27: 1000 mL via INTRAVENOUS

## 2016-07-27 MED ORDER — SODIUM CHLORIDE 0.9 % IV SOLN
INTRAVENOUS | Status: AC
  Administered 2016-07-27: 75 mL/h via INTRAVENOUS

## 2016-07-27 MED ORDER — ONDANSETRON HCL 4 MG/2ML IJ SOLN
4.0000 mg | Freq: Once | INTRAMUSCULAR | Status: AC
  Administered 2016-07-27: 4 mg via INTRAVENOUS
  Filled 2016-07-27: qty 2

## 2016-07-27 MED ORDER — IOPAMIDOL (ISOVUE-300) INJECTION 61%
INTRAVENOUS | Status: AC
  Administered 2016-07-27: 75 mL
  Filled 2016-07-27: qty 75

## 2016-07-27 MED ORDER — TRAZODONE HCL 50 MG PO TABS: 50.0000 mg | ORAL_TABLET | Freq: Every evening | ORAL | Status: DC | PRN

## 2016-07-27 MED ORDER — HYDROMORPHONE HCL 2 MG/ML IJ SOLN
0.5000 mg | INTRAMUSCULAR | Status: DC | PRN
  Administered 2016-07-27: 0.5 mg via INTRAVENOUS
  Filled 2016-07-27: qty 1

## 2016-07-27 MED ORDER — SENNOSIDES-DOCUSATE SODIUM 8.6-50 MG PO TABS
1.0000 | ORAL_TABLET | Freq: Every evening | ORAL | Status: DC | PRN
  Filled 2016-07-27: qty 1

## 2016-07-27 NOTE — Progress Notes (Signed)
Received pt from ED into 6N27, oriented to room, SCDs placed on BLE.

## 2016-07-27 NOTE — H&P (Signed)
History and Physical    Jordan Harmon [id number] DOB: 01/29/1961 DOA: 07/27/2016  PCP: Terald Sleeper, PA-C Patient coming from: home  Chief Complaint: abdominal pain/bilateral leg cramps/nausea and vomiting  HPI: Jordan Harmon is a 56 y.o. female with medical history significant of hypertension, asthma, restless leg, obesity presents to the emergency department with the chief complaint of intractable abdominal pain with nausea and vomiting. Initial evaluation reveals a leukocytosis of 20 lactic acid greater than 4 initially.  Information is obtained from the patient. She states she was in her usual state of health until last night about 1:30 AM she was awakened with bilateral lower extremity cramping. She said she saw her PCP last month as this is an ongoing problem and was diagnosed with restless leg syndrome. She states usually she can "walk it off" but this morning she could not bear weight due to the pain. She then developed abdominal pain located lower quadrants described a sharp constant rated a 10 out of 10. Associated symptoms include nausea one episode of emesis and 2 episodes of loose dark stool. She also reports feeling dizzy and "lightheaded". She denies chest pain palpitation shortness of breath lower extremity edema. She denies dysuria hematuria frequency or urgency. She denies shortness of breath cough fever chills recent travel or sick contacts.    ED Course: In the emergency department she is a fever of 99.9 rectally, initial tachypnea mild tachycardia she is not hypoxic. She is provided with Zofran and fentanyl and Dilaudid. At the time of admission she rates her abdominal pain a 2/10. He has had no further episodes of diarrhea or emesis  Review of Systems: As per HPI otherwise 10 point review of systems negative.   Ambulatory Status: She ambulates independently with a steady gait. Denies any recent falls. Dependent with ADLs  Past Medical History:  Diagnosis Date  .  Adrenal nodule ([place])   . Asthma   . Hypertension   . Obesity   . Pulmonary nodule   . Renal mass   . Restless leg syndrome     Past Surgical History:  Procedure Laterality Date  . ABDOMINAL HYSTERECTOMY      Social History   Social History  . Marital status: Widowed    Spouse name: N/A  . Number of children: N/A  . Years of education: N/A   Occupational History  . Not on file.   Social History Main Topics  . Smoking status: Never Smoker  . Smokeless tobacco: Never Used  . Alcohol use Yes     Comment: OCC  . Drug use: No  . Sexual activity: Not on file   Other Topics Concern  . Not on file   Social History Narrative  . No narrative on file  She lives at home with her children and grandchildren. She is employed full-time at [employer]  . Penicillins Anaphylaxis  . Asa [Aspirin] Hives    Family History  Problem Relation Age of Onset  . Diabetes Mother   . Heart disease Mother     Prior to Admission medications   Medication Sig Start Date End Date Taking? Authorizing Provider  amLODipine (NORVASC) 10 MG tablet Take 1 tablet (10 mg total) by mouth daily. 05/23/16  Yes Terald Sleeper, PA-C  lisinopril-hydrochlorothiazide (PRINZIDE,ZESTORETIC) 20-25 MG tablet Take 1 tablet by mouth daily. 05/23/16  Yes Terald Sleeper, PA-C  traZODone (DESYREL) 50 MG tablet Take 1-2 tablets (50-100 mg total) by mouth  at bedtime as needed for sleep. 05/23/16  Yes Terald Sleeper, PA-C    Physical Exam: Vitals:   07/27/16 0800 07/27/16 0815 07/27/16 0830 07/27/16 0845  BP: 126/71 123/71 125/72 134/67  Pulse: 99 95 100 99  Resp: 16 20 14 17   Temp:      TempSrc:      SpO2: 100% 100% 97% 93%  Weight:      Height:         General:  Appears calm and comfortable, sending texts, using cell phone Eyes:  PERRL, EOMI, normal lids, iris ENT:  grossly normal hearing, lips & tongue, mucous membranes of her mouth are pink slightly dry Neck:  no LAD, masses  or thyromegaly Cardiovascular:  RRR, no m/r/g. No LE edema.  Respiratory:  CTA bilaterally, no w/r/r. Normal respiratory effort. Abdomen:  soft, ntnd, obese positive bowel sounds throughout no guarding or rebounding mild tenderness in lower quadrants Skin:  no rash or induration seen on limited exam Musculoskeletal:  grossly normal tone BUE/BLE, good ROM, no bony abnormality Psychiatric:  grossly normal mood and affect, speech fluent and appropriate, AOx3 Neurologic:  CN 2-12 grossly intact, moves all extremities in coordinated fashion, sensation intact  Labs on Admission: I have personally reviewed following labs and imaging studies  CBC:  Recent Labs Lab 07/27/16 0357  WBC 20.9*  NEUTROABS 17.7*  HGB 14.9  HCT 43.8  MCV 83.1  PLT 123456   Basic Metabolic Panel:  Recent Labs Lab 07/27/16 0357  NA 138  K 3.9  CL 103  CO2 22  GLUCOSE 118*  BUN 23*  CREATININE 0.98  CALCIUM 10.0   GFR: Estimated Creatinine Clearance: 78 mL/min (by C-G formula based on SCr of 0.98 mg/dL). Liver Function Tests:  Recent Labs Lab 07/27/16 0357  AST 32  ALT 28  ALKPHOS 74  BILITOT 0.7  PROT 8.5*  ALBUMIN 4.1    Recent Labs Lab 07/27/16 0357  LIPASE 29   No results for input(s): AMMONIA in the last 168 hours. Coagulation Profile: No results for input(s): INR, PROTIME in the last 168 hours. Cardiac Enzymes: No results for input(s): CKTOTAL, CKMB, CKMBINDEX, TROPONINI in the last 168 hours. BNP (last 3 results) No results for input(s): PROBNP in the last 8760 hours. HbA1C: No results for input(s): HGBA1C in the last 72 hours. CBG: No results for input(s): GLUCAP in the last 168 hours. Lipid Profile: No results for input(s): CHOL, HDL, LDLCALC, TRIG, CHOLHDL, LDLDIRECT in the last 72 hours. Thyroid Function Tests: No results for input(s): TSH, T4TOTAL, FREET4, T3FREE, THYROIDAB in the last 72 hours. Anemia Panel: No results for input(s): VITAMINB12, FOLATE, FERRITIN, TIBC,  IRON, RETICCTPCT in the last 72 hours. Urine analysis:    Component Value Date/Time   COLORURINE YELLOW 07/27/2016 0659   APPEARANCEUR HAZY (A) 07/27/2016 0659   LABSPEC >1.046 (H) 07/27/2016 0659   PHURINE 6.0 07/27/2016 [place] 07/27/2016 0659   HGBUR NEGATIVE 07/27/2016 0659   BILIRUBINUR NEGATIVE 07/27/2016 0659   KETONESUR 5 (A) 07/27/2016 0659   PROTEINUR NEGATIVE 07/27/2016 0659   NITRITE NEGATIVE 07/27/2016 0659   LEUKOCYTESUR NEGATIVE 07/27/2016 0659    Creatinine Clearance: Estimated Creatinine Clearance: 78 mL/min (by C-G formula based on SCr of 0.98 mg/dL).  Sepsis Labs: @LABRCNTIP (procalcitonin:4,lacticidven:4) )No results found for this or any previous visit (from the past 240 hour(s)).   Radiological Exams on Admission: Dg Chest Port 1 View  Result Date: 07/27/2016 CLINICAL DATA:  Dyspnea this morning.  Elevated lactate. EXAM: PORTABLE CHEST 1 VIEW COMPARISON:  01/11/2015 FINDINGS: A single AP portable view of the chest demonstrates no focal airspace consolidation or alveolar edema. The lungs are grossly clear. There is no large effusion or pneumothorax. Cardiac and mediastinal contours appear unremarkable. IMPRESSION: No active disease. Electronically Signed   By: Andreas Newport M.D.   On: 07/27/2016 06:58   Ct Angio Abd/pel W And/or Wo Contrast  Result Date: 07/27/2016 CLINICAL DATA:  Generalized abdominal pain out of proportion to exam. Concern for mesenteric ischemia. EXAM: CTA ABDOMEN AND PELVIS wITHOUT AND WITH CONTRAST TECHNIQUE: Multidetector CT imaging of the abdomen and pelvis was performed using the standard protocol during bolus administration of intravenous contrast. Multiplanar reconstructed images and MIPs were obtained and reviewed to evaluate the vascular anatomy. CONTRAST:  100 cc Isovue 370 intravenous COMPARISON:  None. FINDINGS: VASCULAR Aorta: Mild atherosclerotic plaque at the distal aorta. No aneurysm or dissection. No stenosis.  Celiac: Right hepatic artery has a separate origin from the SMA. Otherwise standard anatomy. There is no stenosis, aneurysm, or beading. No evidence of dissection. SMA: Motion artifact just beyond replaced right hepatic artery ostium. Otherwise unremarkable vessel that is smooth and widely patent. Renals: Single renal arteries with no stenosis or beading. IMA: Patent Inflow: Mild atherosclerotic plaque without stenosis or dissection. Proximal Outflow: Negative Veins: Negative Review of the MIP images confirms the above findings. NON-VASCULAR Lower chest: 10 mm pulmonary nodule in the left posterior costophrenic sulcus. Hepatobiliary: 2 tiny low densities in the right liver are too small to characterize, presumably cysts.No evidence of biliary obstruction or stone. Pancreas: Unremarkable. Spleen: Unremarkable. Adrenals/Urinary Tract: 15 mm right adrenal nodule. No hydronephrosis or stone. 35 mm solid mass in the right kidney along the lower hilum. The mass appears cortical. No associated adenopathy. There is a low-density lesion in the upper pole left kidney measuring 10 mm somewhat indistinct appearance, but too small for definitive characterization Unremarkable bladder. Stomach/Bowel: No obstruction. No appendicitis. Colonic diverticulosis mainly at the sigmoid. No active diverticulitis. Distal colon is collapsed with no definitive wall thickening. Lymphatic:   No mass or adenopathy. Reproductive:Hysterectomy.  No visualized ovaries Other: No ascites or pneumoperitoneum. Musculoskeletal: Severe facet arthropathy at L5-S1 with focally advanced disc degeneration and anterolisthesis. Biforaminal L5 compression. IMPRESSION: VASCULAR No acute finding. Widely patent mesenteric vasculature. No noted ischemic bowel. Mild atherosclerosis NON-VASCULAR 1. 35 mm right renal mass/neoplasm, recommend urology referral. 2. 15 mm right adrenal nodule which could be characterized with MRI or followed. 3. 10 mm pulmonary nodule in  the posterior costophrenic sulcus on the left. Recommend chest CT at follow-up. 4. Notably severe L5-S1 disc and facet degeneration with anterolisthesis and biforaminal L5 compression. Electronically Signed   By: Monte Fantasia M.D.   On: 07/27/2016 07:10    EKG: Independently reviewed. Sinus rhythm Probable anteroseptal infarct, recent No old tracing to compare  Assessment/Plan Principal Problem:   Intractable abdominal pain Active Problems:   Essential hypertension   Leukocytosis   Elevated lactic acid level   Restless leg syndrome   Pulmonary nodule   Renal mass   Adrenal nodule ([facility])   Nausea and vomiting   Obesity   #1. Abdominal pain. Etiology uncertain. May be related to severe restless leg. She has a leukocytosis of 20 and initially an elevated lactic acid of greater than 4. CT abdomen no acute finding. No noted ischemic bowel. Repeat lactic acid within the limits of normal. Lipase within the limits of normal. LFTs within normal limits of  normal Improved on admission -Admit to medical floor -Supportive therapy -Full liquid diet  #2. Persistent nausea and vomiting and diarrhea. Patient continues with nausea on admission but no further emesis or diarrhea since presentation -Scheduled Zofran -Full liquid diet -Gentle IV fluids -gi panel if able -Advance diet as tolerated  #3. Elevated lactic acid/leukocytosis. May be related to dehydration and reactive secondary to above.  Patient afebrile nontoxic appearing. CT as noted above. Lactic acid resolved after fluid resuscitation. Chest x-ray with no active disease. Urinalysis unremarkable. -Follow blood cultures -Track lactic acid -Repeat CBC this afternoon -Monitor vitals closely -Continue IV fluids  #4. Hypertension. Fair control in the emergency department. Home medications include Norvasc, hydrochlorothiazide, lisinopril -We will hold hydrochlorothiazide for now -Continue Norvasc -Monitor closely -Resume  indicated  5.restless leg syndrome. She reports history of same. Worsening last night First visit with PCP November commended nonpharmacological therapies -Robaxin  #6. Renal mass. Incidental finding on CT. Asymptomatic. Concern for malignancy -OP referral to urology  #7. Pulmonary nodule. incendental finding on CT. -will obtain CT chest  #8. Adrenal nodule -OP follow up  DVT prophylaxis: lovenox Code Status: full  Family Communication: sister at bedside  Disposition Plan: home  Consults called: none  Admission status: obs    Dyanne Carrel M MD [facility]  If 7PM-7AM, please contact night-coverage [email or web address] Password [email or web address]  07/27/2016, 10:06 AM

## 2016-07-27 NOTE — ED Provider Notes (Signed)
Patient sign out by Jordan Butts, PA-C  HPI "Jordan Harmon is a 56 y.o. female with a hx of asthma, HTN, hysterectomy presents to the Emergency Department complaining of acute, persistent 10/10 abd pain onset 1:30am waking her from sleep. Associated symptoms include nausea, vomiting and black diarrhea.  Pt denies hematemesis.  Pt reports the pain is sharp and intense.  No radiation, but unable to localize the pain.  Pt given zofran with EMS.  No further vomiting, but nausea persists.  Pt reports SHx of BTL, but denies other abd surgeries or hx of bowel obstruction.  Pt denies fever, chills, headache, neck pain, chest pain, SOB, weakness, dizziness, syncope.  Pt reports she became very lightheaded during emesis, but this has improved.   "  Patient has had extensive work-up, lactic acid was originally elevated but cleared after fluids. CT shows concern for possible neoplasm on kidney at 35 mm, told patient about this- she called me back into room to tell her family members about it as well. Told her this would likely not be addressed in the ED and that she would need to f/u with Urology.  Admitting for obs till return of blood cultures, elevated WBC at 20,000 and initial lactic at > 4. Patient still having severe pain in the abdomen.  Patient admitted to [facility], Dr. Aggie Moats, Obs, [place].  Blood pressure 134/67, pulse 99, temperature 99.9 F (37.7 C), temperature source Rectal, resp. rate 17, height 5\' 7"  (1.702 m), weight 98 kg, SpO2 93 %.        Delos Haring, PA-C 07/27/16 [place], MD 07/27/16 [phone]

## 2016-07-27 NOTE — ED Provider Notes (Signed)
[facility] DEPT Provider Note   CSN: [id number] Arrival date & time: 07/27/16  0307     History   Chief Complaint Chief Complaint  Patient presents with  . Abdominal Pain    HPI Jordan Harmon is a 56 y.o. female with a hx of asthma, HTN, hysterectomy presents to the Emergency Department complaining of acute, persistent 10/10 abd pain onset 1:30am waking her from sleep. Associated symptoms include nausea, vomiting and black diarrhea.  Pt denies hematemesis.  Pt reports the pain is sharp and intense.  No radiation, but unable to localize the pain.  Pt given zofran with EMS.  No further vomiting, but nausea persists.  Pt reports SHx of BTL, but denies other abd surgeries or hx of bowel obstruction.  Pt denies fever, chills, headache, neck pain, chest pain, SOB, weakness, dizziness, syncope.  Pt reports she became very lightheaded during emesis, but this has improved.      The history is provided by the patient and medical records. No language interpreter was used.    Past Medical History:  Diagnosis Date  . Asthma   . Hypertension     Patient Active Problem List   Diagnosis Date Noted  . Essential hypertension 05/23/2016  . Bunion of great toe of left foot 05/23/2016  . Numbness of left foot 05/23/2016    Past Surgical History:  Procedure Laterality Date  . ABDOMINAL HYSTERECTOMY      OB History    No data available       Home Medications    Prior to Admission medications   Medication Sig Start Date End Date Taking? Authorizing Provider  amLODipine (NORVASC) 10 MG tablet Take 1 tablet (10 mg total) by mouth daily. 05/23/16   Terald Sleeper, PA-C  lisinopril-hydrochlorothiazide (PRINZIDE,ZESTORETIC) 20-25 MG tablet Take 1 tablet by mouth daily. 05/23/16   Terald Sleeper, PA-C  traZODone (DESYREL) 50 MG tablet Take 1-2 tablets (50-100 mg total) by mouth at bedtime as needed for sleep. 05/23/16   Terald Sleeper, PA-C    Family History Family History  Problem  Relation Age of Onset  . Diabetes Mother   . Heart disease Mother     Social History Social History  Substance Use Topics  . Smoking status: Never Smoker  . Smokeless tobacco: Never Used  . Alcohol use Yes     Comment: OCC     Allergies   Penicillins and Asa [aspirin]   Review of Systems Review of Systems  Gastrointestinal: Positive for abdominal pain, diarrhea, nausea and vomiting.  All other systems reviewed and are negative.    Physical Exam Updated Vital Signs BP 120/84   Pulse 97   Resp (!) 28   Ht 5\' 7"  (1.702 m)   Wt 98 kg   SpO2 100%   BMI 33.83 kg/m   Physical Exam  Constitutional: She appears well-developed and well-nourished. She appears distressed.  Awake, alert, writhing in bed with pain, tearful  HENT:  Head: Normocephalic and atraumatic.  Mouth/Throat: Oropharynx is clear and moist. No oropharyngeal exudate.  Eyes: Conjunctivae are normal. No scleral icterus.  Neck: Normal range of motion. Neck supple.  Cardiovascular: Normal rate, regular rhythm and intact distal pulses.   Pulmonary/Chest: Effort normal and breath sounds normal. No respiratory distress. She has no wheezes.  Equal chest expansion  Abdominal: Soft. Bowel sounds are normal. She exhibits no distension and no mass. There is generalized tenderness. There is no rigidity, no rebound and no guarding.  Genitourinary:  Rectal exam shows no external hemorrhoid, no internal hemorrhoid, no fissure, no mass, no tenderness, anal tone normal and guaiac negative stool.  Genitourinary Comments: Chaperone present  Musculoskeletal: Normal range of motion. She exhibits no edema.  No midline or paraspinal tenderness  Neurological: She is alert.  Speech is clear and goal oriented Moves extremities without ataxia  Skin: Skin is warm and dry. She is not diaphoretic.  Psychiatric: She has a normal mood and affect.  Nursing note and vitals reviewed.    ED Treatments / Results  Labs (all labs ordered  are listed, but only abnormal results are displayed) Labs Reviewed  CBC WITH DIFFERENTIAL/PLATELET - Abnormal; Notable for the following:       Result Value   WBC 20.9 (*)    RBC 5.27 (*)    Neutro Abs 17.7 (*)    All other components within normal limits  COMPREHENSIVE METABOLIC PANEL - Abnormal; Notable for the following:    Glucose, Bld 118 (*)    BUN 23 (*)    Total Protein 8.5 (*)    All other components within normal limits  I-STAT CG4 LACTIC ACID, ED - Abnormal; Notable for the following:    Lactic Acid, Venous 4.06 (*)    All other components within normal limits  CULTURE, BLOOD (ROUTINE X 2)  CULTURE, BLOOD (ROUTINE X 2)  URINE CULTURE  LIPASE, BLOOD  URINALYSIS, ROUTINE W REFLEX MICROSCOPIC  INFLUENZA PANEL BY PCR (TYPE A & B, H1N1)  POC OCCULT BLOOD, ED  I-STAT TROPOININ, ED  I-STAT CG4 LACTIC ACID, ED    EKG  EKG Interpretation  Date/Time:  Saturday July 27 2016 03:28:15 EST Ventricular Rate:  90 PR Interval:    QRS Duration: 78 QT Interval:  354 QTC Calculation: 434 R Axis:   69 Text Interpretation:  Sinus rhythm Probable anteroseptal infarct, recent No old tracing to compare Confirmed by [facility]  MD, MARTHA [phone]) on 07/27/2016 4:26:21 AM       Radiology Dg Chest Port 1 View  Result Date: 07/27/2016 CLINICAL DATA:  Dyspnea this morning.  Elevated lactate. EXAM: PORTABLE CHEST 1 VIEW COMPARISON:  01/11/2015 FINDINGS: A single AP portable view of the chest demonstrates no focal airspace consolidation or alveolar edema. The lungs are grossly clear. There is no large effusion or pneumothorax. Cardiac and mediastinal contours appear unremarkable. IMPRESSION: No active disease. Electronically Signed   By: Andreas Newport M.D.   On: 07/27/2016 06:58   Ct Angio Abd/pel W And/or Wo Contrast  Result Date: 07/27/2016 CLINICAL DATA:  Generalized abdominal pain out of proportion to exam. Concern for mesenteric ischemia. EXAM: CTA ABDOMEN AND PELVIS wITHOUT AND WITH  CONTRAST TECHNIQUE: Multidetector CT imaging of the abdomen and pelvis was performed using the standard protocol during bolus administration of intravenous contrast. Multiplanar reconstructed images and MIPs were obtained and reviewed to evaluate the vascular anatomy. CONTRAST:  100 cc Isovue 370 intravenous COMPARISON:  None. FINDINGS: VASCULAR Aorta: Mild atherosclerotic plaque at the distal aorta. No aneurysm or dissection. No stenosis. Celiac: Right hepatic artery has a separate origin from the SMA. Otherwise standard anatomy. There is no stenosis, aneurysm, or beading. No evidence of dissection. SMA: Motion artifact just beyond replaced right hepatic artery ostium. Otherwise unremarkable vessel that is smooth and widely patent. Renals: Single renal arteries with no stenosis or beading. IMA: Patent Inflow: Mild atherosclerotic plaque without stenosis or dissection. Proximal Outflow: Negative Veins: Negative Review of the MIP images confirms the above findings. NON-VASCULAR Lower chest:  10 mm pulmonary nodule in the left posterior costophrenic sulcus. Hepatobiliary: 2 tiny low densities in the right liver are too small to characterize, presumably cysts.No evidence of biliary obstruction or stone. Pancreas: Unremarkable. Spleen: Unremarkable. Adrenals/Urinary Tract: 15 mm right adrenal nodule. No hydronephrosis or stone. 35 mm solid mass in the right kidney along the lower hilum. The mass appears cortical. No associated adenopathy. There is a low-density lesion in the upper pole left kidney measuring 10 mm somewhat indistinct appearance, but too small for definitive characterization Unremarkable bladder. Stomach/Bowel: No obstruction. No appendicitis. Colonic diverticulosis mainly at the sigmoid. No active diverticulitis. Distal colon is collapsed with no definitive wall thickening. Lymphatic:   No mass or adenopathy. Reproductive:Hysterectomy.  No visualized ovaries Other: No ascites or pneumoperitoneum.  Musculoskeletal: Severe facet arthropathy at L5-S1 with focally advanced disc degeneration and anterolisthesis. Biforaminal L5 compression. IMPRESSION: VASCULAR No acute finding. Widely patent mesenteric vasculature. No noted ischemic bowel. Mild atherosclerosis NON-VASCULAR 1. 35 mm right renal mass/neoplasm, recommend urology referral. 2. 15 mm right adrenal nodule which could be characterized with MRI or followed. 3. 10 mm pulmonary nodule in the posterior costophrenic sulcus on the left. Recommend chest CT at follow-up. 4. Notably severe L5-S1 disc and facet degeneration with anterolisthesis and biforaminal L5 compression. Electronically Signed   By: Monte Fantasia M.D.   On: 07/27/2016 07:10    Procedures Procedures (including critical care time)  Medications Ordered in ED Medications  fentaNYL (SUBLIMAZE) injection 50 mcg (50 mcg Intravenous Given 07/27/16 0353)  ondansetron (ZOFRAN) injection 4 mg (4 mg Intravenous Given 07/27/16 0401)  HYDROmorphone (DILAUDID) injection 1 mg (1 mg Intravenous Given 07/27/16 0410)  sodium chloride 0.9 % bolus 1,000 mL (0 mLs Intravenous Stopped 07/27/16 0637)  iopamidol (ISOVUE-370) 76 % injection (100 mLs  Contrast Given 07/27/16 0603)  sodium chloride 0.9 % bolus 1,000 mL (0 mLs Intravenous Stopped 07/27/16 0638)    And  sodium chloride 0.9 % bolus 1,000 mL (1,000 mLs Intravenous New Bag/Given 07/27/16 0649)  HYDROmorphone (DILAUDID) injection 1 mg (1 mg Intravenous Given 07/27/16 0719)     Initial Impression / Assessment and Plan / ED Course  I have reviewed the triage vital signs and the nursing notes.  Pertinent labs & imaging results that were available during my care of the patient were reviewed by me and considered in my medical decision making (see chart for details).  Clinical Course as of Jul 27 718  Sat Jul 27, 2016  0707 Significant leukocytosis WBC: (!) 20.9 [HM]  0707 Initial lactic acid greater than 4 but repeat 0.79.  Patient given 30 mL/kg  bolus of fluid.  [HM]  [id number] At shift change care was transferred to Delos Haring, PA-C who will follow pending studies, re-evaulate and admit for further evaluation.    [HM]    Clinical Course User Index [HM] Jarrett Soho Rayshun Kandler, PA-C    Pt Arrives with pain out of proportion. She is writhing in bed. Patient requiring numerous doses of Dilaudid to control pain. Significantly elevated lactic acid and white blood cell count. Concern for possible mesenteric ischemia versus other abdominal etiology.  Patient is afebrile without other infectious symptoms. Will hold antibiotics at this time. Patient has been given sepsis fluid dose of 30 and output kilogram. Chest x-ray without evidence of pneumonia.  Pending UA and CT angiogram of the abdomen.  The patient is noted to have a lactate>4. With the current information available to me, I don't think the patient is in septic shock.  Patient without hypotension or fever. The lactate>4, is likely related to possible mesenteric ischemia.    Final Clinical Impressions(s) / ED Diagnoses   Final diagnoses:  Nausea  Generalized abdominal pain  Increased lactic acid level    New Prescriptions New Prescriptions   No medications on file     Abigail Butts, PA-C 07/27/16 [place], MD 07/27/16 [phone]

## 2016-07-27 NOTE — ED Notes (Signed)
Attempted to call report x 1  

## 2016-07-27 NOTE — ED Notes (Signed)
Nurse will get blood cultures. 

## 2016-07-27 NOTE — ED Triage Notes (Addendum)
Pt transported from home by EMS, Pt c/o LUQ abd pain x 4 hours, +n/v/d, IV est by EMS, 20 L AC, Zofran 4mg  IVP given by EMS Pt states she awoke @ 0130 with severe leg cramping, then severe RUQ abdominal cramping. Pt reports several episodes of black diarrhea with n/v. Pt writhing and tearful on arrival

## 2016-07-28 DIAGNOSIS — R109 Unspecified abdominal pain: Secondary | ICD-10-CM | POA: Diagnosis not present

## 2016-07-28 LAB — BASIC METABOLIC PANEL
ANION GAP: 8 (ref 5–15)
BUN: 6 mg/dL (ref 6–20)
CHLORIDE: 108 mmol/L (ref 101–111)
CO2: 23 mmol/L (ref 22–32)
CREATININE: 0.72 mg/dL (ref 0.44–1.00)
Calcium: 8.3 mg/dL — ABNORMAL LOW (ref 8.9–10.3)
GFR calc non Af Amer: >60 mL/min (ref >60)
Glucose, Bld: 93 mg/dL (ref 65–99)
POTASSIUM: 3.9 mmol/L (ref 3.5–5.1)
SODIUM: 139 mmol/L (ref 135–145)

## 2016-07-28 LAB — CBC
HEMATOCRIT: 35.9 % — AB (ref 36.0–46.0)
HEMOGLOBIN: 11.6 g/dL — AB (ref 12.0–15.0)
MCH: 27.6 pg (ref 26.0–34.0)
MCHC: 32.3 g/dL (ref 30.0–36.0)
MCV: 85.5 fL (ref 78.0–100.0)
PLATELETS: 234 10*3/uL (ref 150–400)
RBC: 4.2 MIL/uL (ref 3.87–5.11)
RDW: 14.7 % (ref 11.5–15.5)
WBC: 5.9 10*3/uL (ref 4.0–10.5)

## 2016-07-28 LAB — URINE CULTURE

## 2016-07-28 MED ORDER — LISINOPRIL-HYDROCHLOROTHIAZIDE 20-25 MG PO TABS: 1.0000 | ORAL_TABLET | Freq: Every day | ORAL | 11 refills | Status: DC

## 2016-07-28 MED ORDER — AMLODIPINE BESYLATE 10 MG PO TABS: 10.0000 mg | ORAL_TABLET | Freq: Every day | ORAL | 11 refills | Status: DC

## 2016-07-28 NOTE — Discharge Instructions (Signed)
Follow with Primary MD Terald Sleeper, PA-C in 7 days   Get CBC, CMP, 2 view Chest X ray checked  by Primary MD or SNF MD in 5-7 days ( we routinely change or add medications that can affect your baseline labs and fluid status, therefore we recommend that you get the mentioned basic workup next visit with your PCP, your PCP may decide not to get them or add new tests based on their clinical decision)   Activity: As tolerated with Full fall precautions use walker/cane & assistance as needed   Disposition Home     Diet:   Heart Healthy   For Heart failure patients - Check your Weight same time everyday, if you gain over 2 pounds, or you develop in leg swelling, experience more shortness of breath or chest pain, call your Primary MD immediately. Follow Cardiac Low Salt Diet and 1.5 lit/day fluid restriction.   On your next visit with your primary care physician please Get Medicines reviewed and adjusted.   Please request your Prim.MD to go over all Hospital Tests and Procedure/Radiological results at the follow up, please get all Hospital records sent to your Prim MD by signing hospital release before you go home.   If you experience worsening of your admission symptoms, develop shortness of breath, life threatening emergency, suicidal or homicidal thoughts you must seek medical attention immediately by calling 911 or calling your MD immediately  if symptoms less severe.  You Must read complete instructions/literature along with all the possible adverse reactions/side effects for all the Medicines you take and that have been prescribed to you. Take any new Medicines after you have completely understood and accpet all the possible adverse reactions/side effects.   Do not drive, operate heavy machinery, perform activities at heights, swimming or participation in water activities or provide baby sitting services if your were admitted for syncope or siezures until you have seen by Primary MD or a  Neurologist and advised to do so again.  Do not drive when taking Pain medications.    Do not take more than prescribed Pain, Sleep and Anxiety Medications  Special Instructions: If you have smoked or chewed Tobacco  in the last 2 yrs please stop smoking, stop any regular Alcohol  and or any Recreational drug use.  Wear Seat belts while driving.   Please note  You were cared for by a hospitalist during your hospital stay. If you have any questions about your discharge medications or the care you received while you were in the hospital after you are discharged, you can call the unit and asked to speak with the hospitalist on call if the hospitalist that took care of you is not available. Once you are discharged, your primary care physician will handle any further medical issues. Please note that NO REFILLS for any discharge medications will be authorized once you are discharged, as it is imperative that you return to your primary care physician (or establish a relationship with a primary care physician if you do not have one) for your aftercare needs so that they can reassess your need for medications and monitor your lab values.

## 2016-07-28 NOTE — Progress Notes (Signed)
     Jordan Harmon was admitted to the Hospital on 07/27/2016 and Discharged  07/28/2016 and should be excused from work/school   for 2 days starting from date -  07/27/2016 , may return to work/school without any restrictions.  Call Lala Lund MD, [facility]  [phone] with questions.  Thurnell Lose M.D on 07/28/2016,at 2:45 PM  [facility]   Office  [phone]

## 2016-07-28 NOTE — Discharge Summary (Signed)
Jordan Harmon [id number] DOB: [birth year] DOA: 07/27/2016  PCP: Terald Sleeper, PA-C  Admit date: 07/27/2016  Discharge date: 07/28/2016  Admitted From: home   Disposition:  home   Recommendations for Outpatient Follow-up:   Follow up with PCP in 1-2 weeks  PCP Please obtain BMP/CBC, 2 view CXR in 1week,  (see Discharge instructions)   PCP Please follow up on the following pending results: Follow CT scan results closely, needs outpatient urology, pulmonary follow-up. Also follow-up adrenal nodule clinically.   Home Health: None  Equipment/Devices: None Consultations: None Discharge Condition: Stable   CODE STATUS: Full   Diet Recommendation:  Heart Healthy     Chief Complaint  Patient presents with  . Abdominal Pain     Brief history of present illness from the day of admission and additional interim summary    Jordan Harmon is a 56 y.o. female with medical history significant of hypertension, asthma, restless leg, obesity presents to the emergency department with the chief complaint of intractable abdominal pain with nausea and vomiting. Initial evaluation reveals a leukocytosis of 20 lactic acid greater than 4 initially  Hospital issues addressed     1.Viral gastroenteritis causing abdominal pain, dehydration, elevated lactate along with diarrhea and nausea vomiting. All resolves promptly by the time she was admitted, clinically no sepsis, she was dehydrated hence her lactate was elevated, she is completely symptom free tolerating diet, labs have all normalized. She did eat a salad few hours prior to this episode. For now since she is symptom free she will be discharged home. Follow with PCP.  2. Incidental finding of lung nodule, renal mass, adrenal nodule. Requested outpatient follow-up with urology  and pulmonary along with PCP to follow didn't nodule.  3. Essential hypertension. Blood pressure is much improved will reintroduce blood pressure medications over the next 48 hours, instructions provided to the patient.  4. Restless leg syndrome. Continue home regimen.   Discharge diagnosis     Principal Problem:   Intractable abdominal pain Active Problems:   Essential hypertension   Leukocytosis   Elevated lactic acid level   Restless leg syndrome   Pulmonary nodule   Renal mass   Adrenal nodule (HCC)   Nausea and vomiting   Obesity    Discharge instructions    Discharge Instructions    Diet - low sodium heart healthy    Complete by:  As directed    Discharge instructions    Complete by:  As directed    Follow with Primary MD Terald Sleeper, PA-C in 7 days   Get CBC, CMP, 2 view Chest X ray checked  by Primary MD or SNF MD in 5-7 days ( we routinely change or add medications that can affect your baseline labs and fluid status, therefore we recommend that you get the mentioned basic workup next visit with your PCP, your PCP may decide not to get them or add new tests based on their clinical decision)   Activity: As tolerated with Full fall precautions  use walker/cane & assistance as needed   Disposition Home     Diet:   Heart Healthy   For Heart failure patients - Check your Weight same time everyday, if you gain over 2 pounds, or you develop in leg swelling, experience more shortness of breath or chest pain, call your Primary MD immediately. Follow Cardiac Low Salt Diet and 1.5 lit/day fluid restriction.   On your next visit with your primary care physician please Get Medicines reviewed and adjusted.   Please request your Prim.MD to go over all Hospital Tests and Procedure/Radiological results at the follow up, please get all Hospital records sent to your Prim MD by signing hospital release before you go home.   If you experience worsening of your admission  symptoms, develop shortness of breath, life threatening emergency, suicidal or homicidal thoughts you must seek medical attention immediately by calling 911 or calling your MD immediately  if symptoms less severe.  You Must read complete instructions/literature along with all the possible adverse reactions/side effects for all the Medicines you take and that have been prescribed to you. Take any new Medicines after you have completely understood and accpet all the possible adverse reactions/side effects.   Do not drive, operate heavy machinery, perform activities at heights, swimming or participation in water activities or provide baby sitting services if your were admitted for syncope or siezures until you have seen by Primary MD or a Neurologist and advised to do so again.  Do not drive when taking Pain medications.    Do not take more than prescribed Pain, Sleep and Anxiety Medications  Special Instructions: If you have smoked or chewed Tobacco  in the last 2 yrs please stop smoking, stop any regular Alcohol  and or any Recreational drug use.  Wear Seat belts while driving.   Please note  You were cared for by a hospitalist during your hospital stay. If you have any questions about your discharge medications or the care you received while you were in the hospital after you are discharged, you can call the unit and asked to speak with the hospitalist on call if the hospitalist that took care of you is not available. Once you are discharged, your primary care physician will handle any further medical issues. Please note that NO REFILLS for any discharge medications will be authorized once you are discharged, as it is imperative that you return to your primary care physician (or establish a relationship with a primary care physician if you do not have one) for your aftercare needs so that they can reassess your need for medications and monitor your lab values.   Increase activity slowly     Complete by:  As directed       Discharge Medications   Allergies as of 07/28/2016      Reactions   Penicillins Anaphylaxis   Asa [aspirin] Hives      Medication List    TAKE these medications   amLODipine 10 MG tablet Commonly known as:  NORVASC Take 1 tablet (10 mg total) by mouth daily. Start taking on:  07/29/2016   lisinopril-hydrochlorothiazide 20-25 MG tablet Commonly known as:  PRINZIDE,ZESTORETIC Take 1 tablet by mouth daily. Start taking on:  07/30/2016   traZODone 50 MG tablet Commonly known as:  DESYREL Take 1-2 tablets (50-100 mg total) by mouth at bedtime as needed for sleep.       Follow-up Information    Terald Sleeper, PA-C. Schedule an appointment as soon as  possible for a visit in 1 week(s).   Specialties:  Physician Assistant, Family Medicine Why:  Follow CT scan results Contact information: [place] [address] [phone]        Alexis Frock, MD. Schedule an appointment as soon as possible for a visit in 1 week(s).   Specialty:  Urology Why:  Renal mass Contact information: [place] [address] [phone]        RAMASWAMY,MURALI, MD. Schedule an appointment as soon as possible for a visit in 1 week(s).   Specialty:  Pulmonary Disease Why:  Lung nodule Contact information: [place] [address] [phone]           Major procedures and Radiology Reports - PLEASE review detailed and final reports thoroughly  -         Ct Chest W Contrast  Result Date: 07/27/2016 CLINICAL DATA:  The patient was awakened with bilateral lower extremity cramping. Dizziness. Lightheadedness. EXAM: CT CHEST WITH CONTRAST TECHNIQUE: Multidetector CT imaging of the chest was performed during intravenous contrast administration. CONTRAST:  43mL ISOVUE-300 IOPAMIDOL (ISOVUE-300) INJECTION 61% COMPARISON:  Chest x-ray from earlier today FINDINGS: Cardiovascular: No definitive coronary artery calcifications  identified on this study. The heart size is at the upper limits of normal. There is an aberrant origin to the right subclavian artery. The branching vessels of the aorta are otherwise normal. The aorta is is poorly assessed due to cardiac motion but no aneurysm or dissection is identified. The central pulmonary arteries are normal. Mediastinum/Nodes: There is small nodule in the right thyroid lobe which is subcentimeter in size. The thyroid is otherwise normal. The esophagus is normal in appearance. No adenopathy. No effusions. Lungs/Pleura: Lungs are clear. No pleural effusion or pneumothorax. Upper Abdomen: High attenuation is seen in the superior left kidney. This could represent contrast in the collecting system rather than a nonobstructive stone. No other abnormalities are seen in the upper abdomen. Musculoskeletal: Degenerative changes are seen in the thoracic spine. No other bony abnormalities. IMPRESSION: 1. No acute abnormalities to explain the patient's symptoms. Electronically Signed   By: Dorise Bullion III M.D   On: 07/27/2016 12:27   Dg Chest Port 1 View  Result Date: 07/27/2016 CLINICAL DATA:  Dyspnea this morning.  Elevated lactate. EXAM: PORTABLE CHEST 1 VIEW COMPARISON:  01/11/2015 FINDINGS: A single AP portable view of the chest demonstrates no focal airspace consolidation or alveolar edema. The lungs are grossly clear. There is no large effusion or pneumothorax. Cardiac and mediastinal contours appear unremarkable. IMPRESSION: No active disease. Electronically Signed   By: Andreas Newport M.D.   On: 07/27/2016 06:58   Ct Angio Abd/pel W And/or Wo Contrast  Result Date: 07/27/2016 CLINICAL DATA:  Generalized abdominal pain out of proportion to exam. Concern for mesenteric ischemia. EXAM: CTA ABDOMEN AND PELVIS wITHOUT AND WITH CONTRAST TECHNIQUE: Multidetector CT imaging of the abdomen and pelvis was performed using the standard protocol during bolus administration of intravenous  contrast. Multiplanar reconstructed images and MIPs were obtained and reviewed to evaluate the vascular anatomy. CONTRAST:  100 cc Isovue 370 intravenous COMPARISON:  None. FINDINGS: VASCULAR Aorta: Mild atherosclerotic plaque at the distal aorta. No aneurysm or dissection. No stenosis. Celiac: Right hepatic artery has a separate origin from the SMA. Otherwise standard anatomy. There is no stenosis, aneurysm, or beading. No evidence of dissection. SMA: Motion artifact just beyond replaced right hepatic artery ostium. Otherwise unremarkable vessel that is smooth and widely  patent. Renals: Single renal arteries with no stenosis or beading. IMA: Patent Inflow: Mild atherosclerotic plaque without stenosis or dissection. Proximal Outflow: Negative Veins: Negative Review of the MIP images confirms the above findings. NON-VASCULAR Lower chest: 10 mm pulmonary nodule in the left posterior costophrenic sulcus. Hepatobiliary: 2 tiny low densities in the right liver are too small to characterize, presumably cysts.No evidence of biliary obstruction or stone. Pancreas: Unremarkable. Spleen: Unremarkable. Adrenals/Urinary Tract: 15 mm right adrenal nodule. No hydronephrosis or stone. 35 mm solid mass in the right kidney along the lower hilum. The mass appears cortical. No associated adenopathy. There is a low-density lesion in the upper pole left kidney measuring 10 mm somewhat indistinct appearance, but too small for definitive characterization Unremarkable bladder. Stomach/Bowel: No obstruction. No appendicitis. Colonic diverticulosis mainly at the sigmoid. No active diverticulitis. Distal colon is collapsed with no definitive wall thickening. Lymphatic:   No mass or adenopathy. Reproductive:Hysterectomy.  No visualized ovaries Other: No ascites or pneumoperitoneum. Musculoskeletal: Severe facet arthropathy at L5-S1 with focally advanced disc degeneration and anterolisthesis. Biforaminal L5 compression. IMPRESSION: VASCULAR No  acute finding. Widely patent mesenteric vasculature. No noted ischemic bowel. Mild atherosclerosis NON-VASCULAR 1. 35 mm right renal mass/neoplasm, recommend urology referral. 2. 15 mm right adrenal nodule which could be characterized with MRI or followed. 3. 10 mm pulmonary nodule in the posterior costophrenic sulcus on the left. Recommend chest CT at follow-up. 4. Notably severe L5-S1 disc and facet degeneration with anterolisthesis and biforaminal L5 compression. Electronically Signed   By: Monte Fantasia M.D.   On: 07/27/2016 07:10    Micro Results     Recent Results (from the past 240 hour(s))  Urine culture     Status: Abnormal   Collection Time: 07/27/16  6:59 AM  Result Value Ref Range Status   Specimen Description URINE, CLEAN CATCH  Final   Special Requests NONE  Final   Culture MULTIPLE SPECIES PRESENT, SUGGEST RECOLLECTION (A)  Final   Report Status 07/28/2016 FINAL  Final    Today   Subjective    Kathyrn Lass today has no headache,no chest abdominal pain,no new weakness tingling or numbness, feels much better wants to go home today.     Objective   Blood pressure 108/60, pulse 72, temperature 98 F (36.7 C), temperature source Oral, resp. rate 17, height 5\' 7"  (1.702 m), weight 98 kg (216 lb), SpO2 98 %.   Intake/Output Summary (Last 24 hours) at 07/28/16 1036 Last data filed at 07/28/16 1004  Gross per 24 hour  Intake              975 ml  Output             1200 ml  Net             -225 ml    Exam Awake Alert, Oriented x 3, No new F.N deficits, Normal affect .AT,PERRAL Supple Neck,No JVD, No cervical lymphadenopathy appriciated.  Symmetrical Chest wall movement, Good air movement bilaterally, CTAB RRR,No Gallops,Rubs or new Murmurs, No Parasternal Heave +ve B.Sounds, Abd Soft, Non tender, No organomegaly appriciated, No rebound -guarding or rigidity. No Cyanosis, Clubbing or edema, No new Rash or bruise   Data Review   CBC w Diff:  Lab Results    Component Value Date   WBC 5.9 07/28/2016   HGB 11.6 (L) 07/28/2016   HCT 35.9 (L) 07/28/2016   PLT 234 07/28/2016   LYMPHOPCT 10 07/27/2016   MONOPCT 5 07/27/2016   EOSPCT  1 07/27/2016   BASOPCT 0 07/27/2016    CMP:  Lab Results  Component Value Date   NA 139 07/28/2016   K 3.9 07/28/2016   CL 108 07/28/2016   CO2 23 07/28/2016   BUN 6 07/28/2016   CREATININE 0.72 07/28/2016   PROT 8.5 (H) 07/27/2016   ALBUMIN 4.1 07/27/2016   BILITOT 0.7 07/27/2016   ALKPHOS 74 07/27/2016   AST 32 07/27/2016   ALT 28 07/27/2016  .   Total Time in preparing paper work, data evaluation and todays exam - 35 minutes  Thurnell Lose M.D on 07/28/2016 at 10:36 AM  [facility]   Office  [phone]

## 2016-07-28 NOTE — Progress Notes (Signed)
Pt discharged to home accomp by family.  Pt understands all of the follow up appointments she needs to get lined up and has the numbers to make appt with.  Pt given note for work.

## 2016-08-01 LAB — CULTURE, BLOOD (ROUTINE X 2)
Culture: NO GROWTH
Culture: NO GROWTH

## 2016-08-02 ENCOUNTER — Encounter: Payer: Self-pay | Admitting: Physician Assistant

## 2016-08-02 ENCOUNTER — Ambulatory Visit (INDEPENDENT_AMBULATORY_CARE_PROVIDER_SITE_OTHER): Payer: BLUE CROSS/BLUE SHIELD | Admitting: Physician Assistant

## 2016-08-02 VITALS — BP 118/74 | HR 91 | Temp 98.8°F | Ht 67.0 in | Wt 223.6 lb

## 2016-08-02 DIAGNOSIS — K7689 Other specified diseases of liver: Secondary | ICD-10-CM | POA: Diagnosis not present

## 2016-08-02 DIAGNOSIS — N2889 Other specified disorders of kidney and ureter: Secondary | ICD-10-CM

## 2016-08-02 DIAGNOSIS — E279 Disorder of adrenal gland, unspecified: Secondary | ICD-10-CM

## 2016-08-02 DIAGNOSIS — E278 Other specified disorders of adrenal gland: Secondary | ICD-10-CM

## 2016-08-02 DIAGNOSIS — R911 Solitary pulmonary nodule: Secondary | ICD-10-CM | POA: Diagnosis not present

## 2016-08-02 NOTE — Patient Instructions (Signed)
Pulmonary Nodule A pulmonary nodule is a small, round spot in your lung. It is usually found when pictures of your lungs are taken for other reasons. Most pulmonary nodules are not cancerous and do not cause symptoms. Tests will be done to make sure the nodule is not cancerous. Pulmonary nodules that are not cancerous usually do not require treatment. Follow these instructions at home:  Only take medicine as told by your doctor.  Follow up with your doctor as told. Contact a doctor if:  You have trouble breathing when doing activities.  You feel sick or more tired than normal.  You do not feel like eating.  You lose weight without trying to.  You have chills.  You have night sweats. Get help right away if:  You cannot catch your breath.  You start making whistling sounds when breathing (wheezing).  You have a cough that does not go away.  You cough up blood.  You are dizzy or feel like you are going to pass out.  You have sudden chest pain.  You have a fever or lasting symptoms for more than 2-3 days.  You have a fever and your symptoms suddenly get worse. This information is not intended to replace advice given to you by your health care provider. Make sure you discuss any questions you have with your health care provider. Document Released: 08/10/2010 Document Revised: 12/14/2015 Document Reviewed: 12/28/2012 [employer] Interactive Patient Education  2017 [employer]  

## 2016-08-04 NOTE — Progress Notes (Signed)
BP 118/74   Pulse 91   Temp 98.8 F (37.1 C) (Oral)   Ht 5\' 7"  (1.702 m)   Wt 223 lb 9.6 oz (101.4 kg)   BMI 35.02 kg/m    Subjective:    Patient ID: Jordan Harmon, female    DOB: 1960-12-13, 56 y.o.   MRN: [id number]  HPI: Jordan Harmon is a 56 y.o. female presenting on 08/02/2016 for Hospitalization Follow-up  Was hospitalized and final decision was a severe food poisoning where she got dehydrated and very weak.  While she was being worked up for infection incidental findings on abdominal CT of right adrenal mass seen and right kidney mass.  Also a lung nodule was seen on scan.  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  There would also be varnish odor in the air and she remembers breathing it in.    Past Medical History:  Diagnosis Date  . Adrenal nodule ([place])   . Anemia   . Asthma   . Depression   . Hypertension   . Obesity   . Pulmonary nodule   . Renal mass   . Restless leg syndrome    Relevant past medical, surgical, family and social history reviewed and updated as indicated. Interim medical history since our last visit reviewed. Allergies and medications reviewed and updated. DATA REVIEWED: CHART IN EPIC  Social History   Social History  . Marital status: Widowed    Spouse name: N/A  . Number of children: N/A  . Years of education: N/A   Occupational History  . Not on file.   Social History Main Topics  . Smoking status: Never Smoker  . Smokeless tobacco: Never Used  . Alcohol use Yes     Comment: OCC  . Drug use: No  . Sexual activity: Not on file   Other Topics Concern  . Not on file   Social History Narrative  . No narrative on file    Past Surgical History:  Procedure Laterality Date  . ABDOMINAL HYSTERECTOMY    . TUBAL LIGATION      Family History  Problem Relation Age of Onset  . Diabetes Mother   . Heart disease Mother     Review of  Systems  Constitutional: Negative.  Negative for activity change, fatigue and fever.  HENT: Negative.  Negative for congestion.   Eyes: Negative.   Respiratory: Negative.  Negative for cough, chest tightness, shortness of breath and wheezing.   Cardiovascular: Negative.  Negative for chest pain and palpitations.  Gastrointestinal: Negative.  Negative for abdominal pain and blood in stool.  Endocrine: Negative.   Genitourinary: Negative.  Negative for difficulty urinating, dyspareunia, dysuria and flank pain.  Musculoskeletal: Negative.   Skin: Negative.   Neurological: Negative.  Negative for dizziness and headaches.  Psychiatric/Behavioral: Negative.     Allergies as of 08/02/2016      Reactions   Penicillins Anaphylaxis   Asa [aspirin] Hives      Medication List       Accurate as of 08/02/16 11:59 PM. Always use your most recent med list.          amLODipine 10 MG tablet Commonly known as:  NORVASC Take 1 tablet (10 mg total) by mouth daily.   lisinopril-hydrochlorothiazide 20-25 MG tablet Commonly known as:  PRINZIDE,ZESTORETIC Take 1 tablet by mouth daily.   traZODone 50 MG tablet Commonly known  as:  DESYREL Take 1-2 tablets (50-100 mg total) by mouth at bedtime as needed for sleep.          Objective:    BP 118/74   Pulse 91   Temp 98.8 F (37.1 C) (Oral)   Ht 5\' 7"  (1.702 m)   Wt 223 lb 9.6 oz (101.4 kg)   BMI 35.02 kg/m   Allergies  Allergen Reactions  . Penicillins Anaphylaxis  . Asa [Aspirin] Hives    Wt Readings from Last 3 Encounters:  08/02/16 223 lb 9.6 oz (101.4 kg)  07/27/16 216 lb (98 kg)  05/23/16 227 lb (103 kg)    Physical Exam  Constitutional: She is oriented to person, place, and time. She appears well-developed and well-nourished.  HENT:  Head: Normocephalic and atraumatic.  Right Ear: Tympanic membrane, external ear and ear canal normal.  Left Ear: Tympanic membrane, external ear and ear canal normal.  Nose: Nose normal. No  rhinorrhea.  Mouth/Throat: Oropharynx is clear and moist and mucous membranes are normal. No oropharyngeal exudate or posterior oropharyngeal erythema.  Eyes: Conjunctivae and EOM are normal. Pupils are equal, round, and reactive to light.  Neck: Normal range of motion. Neck supple.  Cardiovascular: Normal rate, regular rhythm, normal heart sounds and intact distal pulses.   Pulmonary/Chest: Effort normal and breath sounds normal.  Abdominal: Soft. Bowel sounds are normal.  Neurological: She is alert and oriented to person, place, and time. She has normal reflexes.  Skin: Skin is warm and dry. No rash noted.  Psychiatric: She has a normal mood and affect. Her behavior is normal. Judgment and thought content normal.  Nursing note and vitals reviewed.    Results for orders placed or performed during the hospital encounter of 07/27/16  Blood Culture (routine x 2)  Result Value Ref Range   Specimen Description BLOOD LEFT HAND    Special Requests BOTTLES DRAWN AEROBIC AND ANAEROBIC  5CC    Culture NO GROWTH 5 DAYS    Report Status 08/01/2016 FINAL   Blood Culture (routine x 2)  Result Value Ref Range   Specimen Description BLOOD LEFT ANTECUBITAL    Special Requests BOTTLES DRAWN AEROBIC AND ANAEROBIC  5CC    Culture NO GROWTH 5 DAYS    Report Status 08/01/2016 FINAL   Urine culture  Result Value Ref Range   Specimen Description URINE, CLEAN CATCH    Special Requests NONE    Culture MULTIPLE SPECIES PRESENT, SUGGEST RECOLLECTION (A)    Report Status 07/28/2016 FINAL   CBC with Differential  Result Value Ref Range   WBC 20.9 (H) 4.0 - 10.5 K/uL   RBC 5.27 (H) 3.87 - 5.11 MIL/uL   Hemoglobin 14.9 12.0 - 15.0 g/dL   HCT 43.8 36.0 - 46.0 %   MCV 83.1 78.0 - 100.0 fL   MCH 28.3 26.0 - 34.0 pg   MCHC 34.0 30.0 - 36.0 g/dL   RDW 14.2 11.5 - 15.5 %   Platelets 388 150 - 400 K/uL   Neutrophils Relative % 84 %   Neutro Abs 17.7 (H) 1.7 - 7.7 K/uL   Lymphocytes Relative 10 %   Lymphs  Abs 2.0 0.7 - 4.0 K/uL   Monocytes Relative 5 %   Monocytes Absolute 1.0 0.1 - 1.0 K/uL   Eosinophils Relative 1 %   Eosinophils Absolute 0.2 0.0 - 0.7 K/uL   Basophils Relative 0 %   Basophils Absolute 0.0 0.0 - 0.1 K/uL  Comprehensive metabolic panel  Result Value  Ref Range   Sodium 138 135 - 145 mmol/L   Potassium 3.9 3.5 - 5.1 mmol/L   Chloride 103 101 - 111 mmol/L   CO2 22 22 - 32 mmol/L   Glucose, Bld 118 (H) 65 - 99 mg/dL   BUN 23 (H) 6 - 20 mg/dL   Creatinine, Ser 0.98 0.44 - 1.00 mg/dL   Calcium 10.0 8.9 - 10.3 mg/dL   Total Protein 8.5 (H) 6.5 - 8.1 g/dL   Albumin 4.1 3.5 - 5.0 g/dL   AST 32 15 - 41 U/L   ALT 28 14 - 54 U/L   Alkaline Phosphatase 74 38 - 126 U/L   Total Bilirubin 0.7 0.3 - 1.2 mg/dL   GFR calc non Af Amer >60 >60 mL/min   GFR calc Af Amer >60 >60 mL/min   Anion gap 13 5 - 15  Lipase, blood  Result Value Ref Range   Lipase 29 11 - 51 U/L  Urinalysis, Routine w reflex microscopic  Result Value Ref Range   Color, Urine YELLOW YELLOW   APPearance HAZY (A) CLEAR   Specific Gravity, Urine >1.046 (H) 1.005 - 1.030   pH 6.0 5.0 - 8.0   Glucose, UA NEGATIVE NEGATIVE mg/dL   Hgb urine dipstick NEGATIVE NEGATIVE   Bilirubin Urine NEGATIVE NEGATIVE   Ketones, ur 5 (A) NEGATIVE mg/dL   Protein, ur NEGATIVE NEGATIVE mg/dL   Nitrite NEGATIVE NEGATIVE   Leukocytes, UA NEGATIVE NEGATIVE  Influenza panel by PCR (type A & B, H1N1)  Result Value Ref Range   Influenza A By PCR NEGATIVE NEGATIVE   Influenza B By PCR NEGATIVE NEGATIVE  CBC  Result Value Ref Range   WBC 10.8 (H) 4.0 - 10.5 K/uL   RBC 4.45 3.87 - 5.11 MIL/uL   Hemoglobin 12.4 12.0 - 15.0 g/dL   HCT 36.9 36.0 - 46.0 %   MCV 82.9 78.0 - 100.0 fL   MCH 27.9 26.0 - 34.0 pg   MCHC 33.6 30.0 - 36.0 g/dL   RDW 14.1 11.5 - 15.5 %   Platelets 300 150 - 400 K/uL  Ferritin  Result Value Ref Range   Ferritin 57 11 - 307 ng/mL  Basic metabolic panel  Result Value Ref Range   Sodium 139 135 - 145  mmol/L   Potassium 3.9 3.5 - 5.1 mmol/L   Chloride 108 101 - 111 mmol/L   CO2 23 22 - 32 mmol/L   Glucose, Bld 93 65 - 99 mg/dL   BUN 6 6 - 20 mg/dL   Creatinine, Ser 0.72 0.44 - 1.00 mg/dL   Calcium 8.3 (L) 8.9 - 10.3 mg/dL   GFR calc non Af Amer >60 >60 mL/min   GFR calc Af Amer >60 >60 mL/min   Anion gap 8 5 - 15  CBC  Result Value Ref Range   WBC 5.9 4.0 - 10.5 K/uL   RBC 4.20 3.87 - 5.11 MIL/uL   Hemoglobin 11.6 (L) 12.0 - 15.0 g/dL   HCT 35.9 (L) 36.0 - 46.0 %   MCV 85.5 78.0 - 100.0 fL   MCH 27.6 26.0 - 34.0 pg   MCHC 32.3 30.0 - 36.0 g/dL   RDW 14.7 11.5 - 15.5 %   Platelets 234 150 - 400 K/uL  POC occult blood, ED Provider will collect  Result Value Ref Range   Fecal Occult Bld NEGATIVE NEGATIVE  I-Stat CG4 Lactic Acid, ED  Result Value Ref Range   Lactic Acid, Venous 4.06 (HH)  0.5 - 1.9 mmol/L   Comment NOTIFIED PHYSICIAN   I-stat troponin, ED  Result Value Ref Range   Troponin i, poc 0.00 0.00 - 0.08 ng/mL   Comment 3          I-Stat CG4 Lactic Acid, ED  Result Value Ref Range   Lactic Acid, Venous 0.79 0.5 - 1.9 mmol/L      Assessment & Plan:   1. Adrenal mass [facility]) - Ambulatory referral to Urology  2. Right kidney mass - Ambulatory referral to Urology  3. Lung nodule seen on imaging study - Ambulatory referral to Pulmonology  4. Liver cyst continue follow up as directed.   Continue all other maintenance medications as listed above.  Follow up plan: Return if symptoms worsen or fail to improve, for keep follow up.  Orders Placed This Encounter  Procedures  . Ambulatory referral to Urology  . Ambulatory referral to Pulmonology    Educational handout given for PULMONARY NODULE  Terald Sleeper PA-C [facility] [address]  [place], [place] [address] [phone]   08/04/2016, 6:26 PM

## 2016-09-02 ENCOUNTER — Ambulatory Visit (INDEPENDENT_AMBULATORY_CARE_PROVIDER_SITE_OTHER): Payer: BLUE CROSS/BLUE SHIELD | Admitting: Internal Medicine

## 2016-09-02 ENCOUNTER — Encounter: Payer: Self-pay | Admitting: Internal Medicine

## 2016-09-02 VITALS — BP 132/82 | HR 91 | Ht 67.0 in | Wt 227.0 lb

## 2016-09-02 DIAGNOSIS — R938 Abnormal findings on diagnostic imaging of other specified body structures: Secondary | ICD-10-CM

## 2016-09-02 DIAGNOSIS — R9389 Abnormal findings on diagnostic imaging of other specified body structures: Secondary | ICD-10-CM

## 2016-09-02 DIAGNOSIS — R05 Cough: Secondary | ICD-10-CM

## 2016-09-02 DIAGNOSIS — R058 Other specified cough: Secondary | ICD-10-CM

## 2016-09-02 NOTE — Progress Notes (Signed)
Subjective:     Patient ID: Jordan Harmon, female   DOB: [birth year],    MRN: [id number]  HPI  20 yobf never smoker with hbp/ admitted with food poisoning:   Admit date: 07/27/2016  Discharge date: 07/28/2016   Recommendations for Outpatient Follow-up:   Follow up with PCP in 1-2 weeks  PCP Please obtain BMP/CBC, 2 view CXR in 1week,  (see Discharge instructions)   PCP Please follow up on the following pending results: Follow CT scan results closely, needs outpatient urology, pulmonary follow-up. Also follow-up adrenal nodule clinically.    Brief history of present illness from the day of admission and additional interim summary    Jordan Harmon a 56 y.o.femalewith medical history significant of hypertension, asthma, restless leg, obesity presents to the emergency department with the chief complaint of intractable abdominal pain with nausea and vomiting. Initial evaluation reveals a leukocytosis of 20 lactic acid greater than 4 initially  Hospital issues addressed     1.Viral gastroenteritis causing abdominal pain, dehydration, elevated lactate along with diarrhea and nausea vomiting. All resolves promptly by the time she was admitted, clinically no sepsis, she was dehydrated hence her lactate was elevated, she is completely symptom free tolerating diet, labs have all normalized. She did eat a salad few hours prior to this episode. For now since she is symptom free she will be discharged home. Follow with PCP.  2. Incidental finding of lung nodule, renal mass, adrenal nodule. Requested outpatient follow-up with urology and pulmonary along with PCP to follow didn't nodule.  3. Essential hypertension. Blood pressure is much improved will reintroduce blood pressure medications over the next 48 hours, instructions provided to the patient.  4. Restless leg syndrome. Continue home regimen.   Discharge diagnosis     Principal Problem:   Intractable abdominal  pain Active Problems:   Essential hypertension   Leukocytosis   Elevated lactic acid level   Restless leg syndrome   Pulmonary nodule   Renal mass   Adrenal nodule ([facility])   Nausea and vomiting   Obesity     09/02/2016 1st [facility] office visit/ Julious Langlois  Re abn CT Chief Complaint  Patient presents with  . Pulmonary Consult    Referred by Dr. Particia Nearing for eval of abn ct chest. She denies any respiratory co's.   on acei does occ cough at hs and in spring with pollen but rx with zyrtec prn works fine   Not limited by breathing from desired activities  But not aerobically active  No obvious day to day or daytime variability or assoc excess/ purulent sputum or mucus plugs or hemoptysis or cp or chest tightness, subjective wheeze or overt sinus or hb symptoms. No unusual exp hx or h/o childhood pna/ asthma or knowledge of premature birth.  Sleeping ok without nocturnal  or early am exacerbation  of respiratory  c/o's or need for noct saba. Also denies any obvious fluctuation of symptoms with weather or environmental changes or other aggravating or alleviating factors except as outlined above   Current Medications, Allergies, Complete Past Medical History, Past Surgical History, Family History, and Social History were reviewed in [employer] record.  ROS  The following are not active complaints unless bolded sore throat, dysphagia, dental problems, itching, sneezing,  nasal congestion or excess/ purulent secretions, ear ache,   fever, chills, sweats, unintended wt loss, classically pleuritic or exertional cp,  orthopnea pnd or leg swelling, presyncope, palpitations, abdominal pain, anorexia, nausea, vomiting,  diarrhea  or change in bowel or bladder habits, change in stools or urine, dysuria,hematuria,  rash, arthralgias, visual complaints, headache, numbness, weakness or ataxia or problems with walking or coordination,  change in mood/affect or memory.          Review of Systems     Objective:   Physical Exam    obese pleasant but somewhat anxious bf nad  Wt Readings from Last 3 Encounters:  09/02/16 227 lb (103 kg)  08/02/16 223 lb 9.6 oz (101.4 kg)  07/27/16 216 lb (98 kg)    Vital signs reviewed - - Note on arrival 02 sats  100% on RA      HEENT: nl dentition, turbinates, and oropharynx. Nl external ear canals without cough reflex   NECK :  without JVD/Nodes/TM/ nl carotid upstrokes bilaterally   LUNGS: no acc muscle use,  Nl contour chest which is clear to A and P bilaterally without cough on insp or exp maneuvers   CV:  RRR  no s3 or murmur or increase in P2, nad no edema   ABD:  soft and nontender with nl inspiratory excursion in the supine position. No bruits or organomegaly appreciated, bowel sounds nl  MS:  Nl gait/ ext warm without deformities, calf tenderness, cyanosis or clubbing No obvious joint restrictions   SKIN: warm and dry without lesions    NEURO:  alert, approp, nl sensorium with  no motor or cerebellar deficits apparent.      I personally reviewed images and agree with radiology impression as follows:  CTwith contrast Chest  07/27/16 No chest findings, the previous density in the L base is likely atx related but is not present on this study   Assessment:

## 2016-09-02 NOTE — Patient Instructions (Addendum)
At some point you may  need a trial off the lisinopril if your night time or spring respiratory symptoms worsen  Pulmonary followup is as needed

## 2016-09-03 DIAGNOSIS — R9389 Abnormal findings on diagnostic imaging of other specified body structures: Secondary | ICD-10-CM | POA: Insufficient documentation

## 2016-09-03 DIAGNOSIS — R05 Cough: Secondary | ICD-10-CM | POA: Insufficient documentation

## 2016-09-03 DIAGNOSIS — R058 Other specified cough: Secondary | ICD-10-CM | POA: Insufficient documentation

## 2016-09-03 NOTE — Assessment & Plan Note (Signed)
See f/u ct chest  07/27/16 wnl c/w prev atx changes  In this never smoker there is very low risk of bronchogenic ca so no directed f/u is needed  Discussed in detail all the  indications, usual  risks and alternatives  relative to the benefits with patient who agrees to proceed with conservative f/u as outlined    Total time devoted to counseling  > 50 % of initial 60 min office visit:  review case including admit and previous "suspicious imaging studies"  with pt/ discussion of options/alternatives/ personally creating written customized instructions  in presence of pt  then going over those specific  Instructions directly with the pt including how to use all of the meds but in particular covering each new medication in detail and the difference between the maintenance= "automatic" meds and the prns using an action plan format for the latter (If this problem/symptom => do that organization reading Left to right).  Please see AVS from this visit for a full list of these instructions which I personally wrote for this pt and  are unique to this visit.

## 2016-09-03 NOTE — Assessment & Plan Note (Signed)
Chronic and mild and worse with pollen and most suggestive of Upper airway cough syndrome (previously labeled PNDS) , is  so named because it's frequently impossible to sort out how much is  CR/sinusitis with freq throat clearing (which can be related to primary GERD)   vs  causing  secondary (" extra esophageal")  GERD from wide swings in gastric pressure that occur with throat clearing, often  promoting self use of mint and menthol lozenges that reduce the lower esophageal sphincter tone and exacerbate the problem further in a cyclical fashion.   These are the same pts (now being labeled as having "irritable larynx syndrome" by some cough centers) who not infrequently have a history of having failed to tolerate ace inhibitors,  dry powder inhalers or biphosphonates or report having atypical/extraesophageal reflux symptoms that don't respond to standard doses of PPI  and are easily confused as having aecopd or asthma flares by even experienced allergists/ pulmonologists (myself included).  Before considering additional pulmonary w/u would rec 6 weeks off acei so we'll see her back here prn.

## 2016-09-03 NOTE — Assessment & Plan Note (Signed)
Body mass index is 35.55   trending up  No results found for: TSH   Contributing to gerd risk/ doe/reviewed the need and the process to achieve and maintain neg calorie balance > defer f/u primary care including intermittently monitoring thyroid status

## 2016-09-10 ENCOUNTER — Other Ambulatory Visit: Payer: Self-pay | Admitting: Urology

## 2016-09-10 DIAGNOSIS — D4101 Neoplasm of uncertain behavior of right kidney: Secondary | ICD-10-CM

## 2016-09-10 DIAGNOSIS — D4102 Neoplasm of uncertain behavior of left kidney: Secondary | ICD-10-CM

## 2016-09-20 ENCOUNTER — Ambulatory Visit
Admission: RE | Admit: 2016-09-20 | Discharge: 2016-09-20 | Disposition: A | Discharge status: Home / Self Care | Payer: BLUE CROSS/BLUE SHIELD | Source: Ambulatory Visit | Attending: Urology | Admitting: Urology

## 2016-09-20 DIAGNOSIS — D4102 Neoplasm of uncertain behavior of left kidney: Secondary | ICD-10-CM

## 2016-09-20 DIAGNOSIS — D4101 Neoplasm of uncertain behavior of right kidney: Secondary | ICD-10-CM

## 2016-09-23 ENCOUNTER — Other Ambulatory Visit: Payer: BLUE CROSS/BLUE SHIELD

## 2016-10-09 ENCOUNTER — Other Ambulatory Visit: Payer: Self-pay | Admitting: Urology

## 2016-11-15 NOTE — Patient Instructions (Signed)
Jordan Harmon  11/15/2016   Your procedure is scheduled on: 11/20/16  Report to [facility] Main  Entrance Take [place]  elevators to 3rd floor to  [place] at     [phone].    Call this number if you have problems the morning of surgery [phone]    Remember: ONLY 1 PERSON MAY GO WITH YOU TO SHORT STAY TO GET  READY MORNING OF YOUR SURGERY.  Do not eat  or drink liquids :After Midnight.      FOLLOW A CLEAR LIQUID DIET  THE DAY BEFORE SURGERY  ONE BOTTLE OF MAGNESIUM CITRATE BY NOON DAY BEFORE SURGERY   Foods Allowed                                                                     Foods Excluded  Coffee and tea, regular and decaf                             liquids that you cannot  Plain Jell-O in any flavor                                             see through such as: Fruit ices (not with fruit pulp)                                     milk, soups, orange juice  Iced Popsicles                                    All solid food Carbonated beverages, regular and diet                                    Cranberry, grape and apple juices Sports drinks like Gatorade Lightly seasoned clear broth or consume(fat free) Sugar, honey syrup  Sample Menu Breakfast                                Lunch                                     Supper Cranberry juice                    Beef broth                            Chicken broth Jell-O  Grape juice                           Apple juice Coffee or tea                        Jell-O                                      Popsicle                                                Coffee or tea                        Coffee or tea  _____________________________________________________________________     Take these medicines the morning of surgery with A SIP OF WATER: amlodipine (norvasc)                                You may not have any metal on your body including hair  pins and              piercings  Do not wear jewelry, make-up, lotions, powders or perfumes, deodorant             Do not wear nail polish.  Do not shave  48 hours prior to surgery.     Do not bring valuables to the hospital. [place].  Contacts, dentures or bridgework may not be worn into surgery.  Leave suitcase in the car. After surgery it may be brought to your room.     Patients discharged the day of surgery will not be allowed to drive home.  Name and phone number of your driver:  Special Instructions: N/A              Please read over the following fact sheets you were given: _____________________________________________________________________             [facility] - Preparing for Surgery Before surgery, you can play an important role.  Because skin is not sterile, your skin needs to be as free of germs as possible.  You can reduce the number of germs on your skin by washing with CHG (chlorahexidine gluconate) soap before surgery.  CHG is an antiseptic cleaner which kills germs and bonds with the skin to continue killing germs even after washing. Please DO NOT use if you have an allergy to CHG or antibacterial soaps.  If your skin becomes reddened/irritated stop using the CHG and inform your nurse when you arrive at Short Stay. Do not shave (including legs and underarms) for at least 48 hours prior to the first CHG shower.  You may shave your face/neck. Please follow these instructions carefully:  1.  Shower with CHG Soap the night before surgery and the  morning of Surgery.  2.  If you choose to wash your hair, wash your hair first as usual with your  normal  shampoo.  3.  After you shampoo, rinse your hair and body thoroughly to remove the  shampoo.                           4.  Use CHG as you would any other liquid soap.  You can apply chg directly  to the skin and wash                       Gently with a scrungie or clean  washcloth.  5.  Apply the CHG Soap to your body ONLY FROM THE NECK DOWN.   Do not use on face/ open                           Wound or open sores. Avoid contact with eyes, ears mouth and genitals (private parts).                       Wash face,  Genitals (private parts) with your normal soap.             6.  Wash thoroughly, paying special attention to the area where your surgery  will be performed.  7.  Thoroughly rinse your body with warm water from the neck down.  8.  DO NOT shower/wash with your normal soap after using and rinsing off  the CHG Soap.                9.  Pat yourself dry with a clean towel.            10.  Wear clean pajamas.            11.  Place clean sheets on your bed the night of your first shower and do not  sleep with pets. Day of Surgery : Do not apply any lotions/deodorants the morning of surgery.  Please wear clean clothes to the hospital/surgery center.  FAILURE TO FOLLOW THESE INSTRUCTIONS MAY RESULT IN THE CANCELLATION OF YOUR SURGERY PATIENT SIGNATURE_________________________________  NURSE SIGNATURE__________________________________  ________________________________________________________________________  WHAT IS A BLOOD TRANSFUSION? Blood Transfusion Information  A transfusion is the replacement of blood or some of its parts. Blood is made up of multiple cells which provide different functions.  Red blood cells carry oxygen and are used for blood loss replacement.  White blood cells fight against infection.  Platelets control bleeding.  Plasma helps clot blood.  Other blood products are available for specialized needs, such as hemophilia or other clotting disorders. BEFORE THE TRANSFUSION  Who gives blood for transfusions?   Healthy volunteers who are fully evaluated to make sure their blood is safe. This is blood bank blood. Transfusion therapy is the safest it has ever been in the practice of medicine. Before blood is taken from a donor, a  complete history is taken to make sure that person has no history of diseases nor engages in risky social behavior (examples are intravenous drug use or sexual activity with multiple partners). The donor's travel history is screened to minimize risk of transmitting infections, such as malaria. The donated blood is tested for signs of infectious diseases, such as HIV and hepatitis. The blood is then tested to be sure it is compatible with you in order to minimize the chance of a transfusion reaction. If you or a relative donates blood, this is often done in anticipation of surgery and is not appropriate for emergency situations. It takes many days to process the donated blood. RISKS AND COMPLICATIONS  Although transfusion therapy is very safe and saves many lives, the main dangers of transfusion include:   Getting an infectious disease.  Developing a transfusion reaction. This is an allergic reaction to something in the blood you were given. Every precaution is taken to prevent this. The decision to have a blood transfusion has been considered carefully by your caregiver before blood is given. Blood is not given unless the benefits outweigh the risks. AFTER THE TRANSFUSION  Right after receiving a blood transfusion, you will usually feel much better and more energetic. This is especially true if your red blood cells have gotten low (anemic). The transfusion raises the level of the red blood cells which carry oxygen, and this usually causes an energy increase.  The nurse administering the transfusion will monitor you carefully for complications. HOME CARE INSTRUCTIONS  No special instructions are needed after a transfusion. You may find your energy is better. Speak with your caregiver about any limitations on activity for underlying diseases you may have. SEEK MEDICAL CARE IF:   Your condition is not improving after your transfusion.  You develop redness or irritation at the intravenous (IV)  site. SEEK IMMEDIATE MEDICAL CARE IF:  Any of the following symptoms occur over the next 12 hours:  Shaking chills.  You have a temperature by mouth above 102 F (38.9 C), not controlled by medicine.  Chest, back, or muscle pain.  People around you feel you are not acting correctly or are confused.  Shortness of breath or difficulty breathing.  Dizziness and fainting.  You get a rash or develop hives.  You have a decrease in urine output.  Your urine turns a dark color or changes to pink, red, or brown. Any of the following symptoms occur over the next 10 days:  You have a temperature by mouth above 102 F (38.9 C), not controlled by medicine.  Shortness of breath.  Weakness after normal activity.  The white part of the eye turns yellow (jaundice).  You have a decrease in the amount of urine or are urinating less often.  Your urine turns a dark color or changes to pink, red, or brown. Document Released: 07/05/2000 Document Revised: 09/30/2011 Document Reviewed: 02/22/2008 [facility] Patient Information 2014 [place], [place].  _______________________________________________________________________

## 2016-11-15 NOTE — Progress Notes (Signed)
lov 09/02/16  ekg 07/27/16   ct chest cxr 07/27/16  All in epic

## 2016-11-18 ENCOUNTER — Encounter (HOSPITAL_COMMUNITY)
Admission: RE | Admit: 2016-11-18 | Discharge: 2016-11-18 | Disposition: A | Discharge status: Home / Self Care | Payer: BLUE CROSS/BLUE SHIELD | Source: Ambulatory Visit | Attending: Urology | Admitting: Urology

## 2016-11-18 ENCOUNTER — Encounter (HOSPITAL_COMMUNITY): Payer: Self-pay

## 2016-11-18 DIAGNOSIS — Z01812 Encounter for preprocedural laboratory examination: Secondary | ICD-10-CM | POA: Insufficient documentation

## 2016-11-18 DIAGNOSIS — D3500 Benign neoplasm of unspecified adrenal gland: Secondary | ICD-10-CM | POA: Insufficient documentation

## 2016-11-18 LAB — CBC
HCT: 41.3 % (ref 36.0–46.0)
HEMOGLOBIN: 13.7 g/dL (ref 12.0–15.0)
MCH: 27.9 pg (ref 26.0–34.0)
MCHC: 33.2 g/dL (ref 30.0–36.0)
MCV: 84.1 fL (ref 78.0–100.0)
Platelets: 318 10*3/uL (ref 150–400)
RBC: 4.91 MIL/uL (ref 3.87–5.11)
RDW: 13.2 % (ref 11.5–15.5)
WBC: 9.3 10*3/uL (ref 4.0–10.5)

## 2016-11-18 LAB — BASIC METABOLIC PANEL
ANION GAP: 9 (ref 5–15)
BUN: 16 mg/dL (ref 6–20)
CALCIUM: 9.4 mg/dL (ref 8.9–10.3)
CO2: 28 mmol/L (ref 22–32)
Chloride: 102 mmol/L (ref 101–111)
Creatinine, Ser: 0.68 mg/dL (ref 0.44–1.00)
GFR calc non Af Amer: >60 mL/min (ref >60)
GLUCOSE: 89 mg/dL (ref 65–99)
Potassium: 4 mmol/L (ref 3.5–5.1)
SODIUM: 139 mmol/L (ref 135–145)

## 2016-11-18 LAB — ABO/RH: ABO/RH(D): O POS

## 2016-11-20 ENCOUNTER — Inpatient Hospital Stay (HOSPITAL_COMMUNITY): Payer: BLUE CROSS/BLUE SHIELD | Admitting: Anesthesiology

## 2016-11-20 ENCOUNTER — Encounter (HOSPITAL_COMMUNITY)
Admission: RE | Disposition: A | Discharge status: Home / Self Care | Payer: Self-pay | Source: Ambulatory Visit | Attending: Urology

## 2016-11-20 ENCOUNTER — Inpatient Hospital Stay (HOSPITAL_COMMUNITY)
Admission: RE | Admit: 2016-11-20 | Discharge: 2016-11-21 | DRG: 661 | Disposition: A | Discharge status: Home / Self Care | Payer: BLUE CROSS/BLUE SHIELD | Source: Ambulatory Visit | Attending: Urology | Admitting: Urology

## 2016-11-20 ENCOUNTER — Encounter (HOSPITAL_COMMUNITY): Payer: Self-pay | Admitting: General Practice

## 2016-11-20 ENCOUNTER — Ambulatory Visit: Payer: BLUE CROSS/BLUE SHIELD | Admitting: Physician Assistant

## 2016-11-20 DIAGNOSIS — Z91010 Allergy to peanuts: Secondary | ICD-10-CM

## 2016-11-20 DIAGNOSIS — G2581 Restless legs syndrome: Secondary | ICD-10-CM | POA: Diagnosis present

## 2016-11-20 DIAGNOSIS — Z91040 Latex allergy status: Secondary | ICD-10-CM | POA: Diagnosis not present

## 2016-11-20 DIAGNOSIS — N2889 Other specified disorders of kidney and ureter: Secondary | ICD-10-CM | POA: Diagnosis present

## 2016-11-20 DIAGNOSIS — I10 Essential (primary) hypertension: Secondary | ICD-10-CM | POA: Diagnosis present

## 2016-11-20 DIAGNOSIS — Z833 Family history of diabetes mellitus: Secondary | ICD-10-CM | POA: Diagnosis not present

## 2016-11-20 DIAGNOSIS — Z886 Allergy status to analgesic agent status: Secondary | ICD-10-CM | POA: Diagnosis not present

## 2016-11-20 DIAGNOSIS — Z8249 Family history of ischemic heart disease and other diseases of the circulatory system: Secondary | ICD-10-CM

## 2016-11-20 HISTORY — PX: ROBOT ASSISTED LAPAROSCOPIC NEPHRECTOMY: SHX5140

## 2016-11-20 LAB — HEMOGLOBIN AND HEMATOCRIT, BLOOD
HEMATOCRIT: 37.3 % (ref 36.0–46.0)
Hemoglobin: 12.2 g/dL (ref 12.0–15.0)

## 2016-11-20 LAB — TYPE AND SCREEN
ABO/RH(D): O POS
Antibody Screen: NEGATIVE

## 2016-11-20 SURGERY — NEPHRECTOMY, RADICAL, ROBOT-ASSISTED, LAPAROSCOPIC, ADULT
Anesthesia: General | Laterality: Right

## 2016-11-20 MED ORDER — HYDROCODONE-ACETAMINOPHEN 5-325 MG PO TABS
1.0000 | ORAL_TABLET | Freq: Four times a day (QID) | ORAL | 0 refills | Status: DC | PRN
Start: 2016-11-20 — End: 2017-01-06

## 2016-11-20 MED ORDER — PROPOFOL 10 MG/ML IV BOLUS
INTRAVENOUS | Status: AC
  Filled 2016-11-20: qty 20

## 2016-11-20 MED ORDER — FENTANYL CITRATE (PF) 250 MCG/5ML IJ SOLN
INTRAMUSCULAR | Status: AC
  Filled 2016-11-20: qty 5

## 2016-11-20 MED ORDER — SUGAMMADEX SODIUM 200 MG/2ML IV SOLN
INTRAVENOUS | Status: AC
  Filled 2016-11-20: qty 2

## 2016-11-20 MED ORDER — SUGAMMADEX SODIUM 200 MG/2ML IV SOLN
INTRAVENOUS | Status: DC | PRN
  Administered 2016-11-20: 200 mg via INTRAVENOUS

## 2016-11-20 MED ORDER — STERILE WATER FOR IRRIGATION IR SOLN
Status: DC | PRN
  Administered 2016-11-20: 1000 mL

## 2016-11-20 MED ORDER — BUPIVACAINE LIPOSOME 1.3 % IJ SUSP
20.0000 mL | Freq: Once | INTRAMUSCULAR | Status: AC
  Administered 2016-11-20: 20 mL
  Filled 2016-11-20: qty 20

## 2016-11-20 MED ORDER — ONDANSETRON HCL 4 MG/2ML IJ SOLN: 4.0000 mg | INTRAMUSCULAR | Status: DC | PRN

## 2016-11-20 MED ORDER — MAGNESIUM CITRATE PO SOLN: 1.0000 | Freq: Once | ORAL | Status: DC

## 2016-11-20 MED ORDER — ROCURONIUM BROMIDE 100 MG/10ML IV SOLN
INTRAVENOUS | Status: DC | PRN
  Administered 2016-11-20: 20 mg via INTRAVENOUS
  Administered 2016-11-20: 10 mg via INTRAVENOUS
  Administered 2016-11-20: 50 mg via INTRAVENOUS

## 2016-11-20 MED ORDER — ACETAMINOPHEN 500 MG PO TABS
1000.0000 mg | ORAL_TABLET | Freq: Four times a day (QID) | ORAL | Status: AC
  Administered 2016-11-20 – 2016-11-21 (×4): 1000 mg via ORAL
  Filled 2016-11-20 (×4): qty 2

## 2016-11-20 MED ORDER — OXYCODONE HCL 5 MG PO TABS
5.0000 mg | ORAL_TABLET | ORAL | Status: DC | PRN
  Administered 2016-11-21 (×2): 5 mg via ORAL
  Filled 2016-11-20 (×2): qty 1

## 2016-11-20 MED ORDER — PROPOFOL 10 MG/ML IV BOLUS
INTRAVENOUS | Status: DC | PRN
  Administered 2016-11-20: 50 mg via INTRAVENOUS
  Administered 2016-11-20: 150 mg via INTRAVENOUS

## 2016-11-20 MED ORDER — DEXAMETHASONE SODIUM PHOSPHATE 10 MG/ML IJ SOLN
INTRAMUSCULAR | Status: AC
  Filled 2016-11-20: qty 1

## 2016-11-20 MED ORDER — HYDROMORPHONE HCL 1 MG/ML IJ SOLN
0.5000 mg | INTRAMUSCULAR | Status: DC | PRN
  Administered 2016-11-20 (×3): 1 mg via INTRAVENOUS
  Filled 2016-11-20 (×3): qty 1

## 2016-11-20 MED ORDER — DEXTROSE-NACL 5-0.45 % IV SOLN
INTRAVENOUS | Status: DC
  Administered 2016-11-20: 1000 mL via INTRAVENOUS
  Administered 2016-11-20: 23:00:00 via INTRAVENOUS

## 2016-11-20 MED ORDER — CIPROFLOXACIN IN D5W 400 MG/200ML IV SOLN
400.0000 mg | INTRAVENOUS | Status: AC
  Administered 2016-11-20: 400 mg via INTRAVENOUS

## 2016-11-20 MED ORDER — PROMETHAZINE HCL 25 MG/ML IJ SOLN: 6.2500 mg | INTRAMUSCULAR | Status: DC | PRN

## 2016-11-20 MED ORDER — MIDAZOLAM HCL 5 MG/5ML IJ SOLN
INTRAMUSCULAR | Status: DC | PRN
  Administered 2016-11-20: 2 mg via INTRAVENOUS

## 2016-11-20 MED ORDER — MIDAZOLAM HCL 2 MG/2ML IJ SOLN
INTRAMUSCULAR | Status: AC
  Filled 2016-11-20: qty 2

## 2016-11-20 MED ORDER — CLINDAMYCIN PHOSPHATE 900 MG/50ML IV SOLN
900.0000 mg | INTRAVENOUS | Status: AC
  Administered 2016-11-20: 900 mg via INTRAVENOUS

## 2016-11-20 MED ORDER — LACTATED RINGERS IV SOLN
INTRAVENOUS | Status: DC | PRN
  Administered 2016-11-20 (×2): via INTRAVENOUS

## 2016-11-20 MED ORDER — HYDROMORPHONE HCL 1 MG/ML IJ SOLN
INTRAMUSCULAR | Status: AC
  Administered 2016-11-20: 0.5 mg via INTRAVENOUS
  Filled 2016-11-20: qty 2

## 2016-11-20 MED ORDER — SODIUM CHLORIDE 0.9 % IJ SOLN
INTRAMUSCULAR | Status: DC | PRN
  Administered 2016-11-20: 20 mL

## 2016-11-20 MED ORDER — AMLODIPINE BESYLATE 10 MG PO TABS
10.0000 mg | ORAL_TABLET | Freq: Every day | ORAL | Status: DC
  Administered 2016-11-21: 10 mg via ORAL
  Filled 2016-11-20: qty 1

## 2016-11-20 MED ORDER — ROCURONIUM BROMIDE 50 MG/5ML IV SOSY
PREFILLED_SYRINGE | INTRAVENOUS | Status: AC
  Filled 2016-11-20: qty 5

## 2016-11-20 MED ORDER — ONDANSETRON HCL 4 MG/2ML IJ SOLN
INTRAMUSCULAR | Status: DC | PRN
  Administered 2016-11-20: 4 mg via INTRAVENOUS

## 2016-11-20 MED ORDER — DIPHENHYDRAMINE HCL 50 MG/ML IJ SOLN: 12.5000 mg | Freq: Four times a day (QID) | INTRAMUSCULAR | Status: DC | PRN

## 2016-11-20 MED ORDER — CLINDAMYCIN PHOSPHATE 900 MG/50ML IV SOLN
INTRAVENOUS | Status: AC
  Filled 2016-11-20: qty 50

## 2016-11-20 MED ORDER — DIPHENHYDRAMINE HCL 12.5 MG/5ML PO ELIX: 12.5000 mg | ORAL_SOLUTION | Freq: Four times a day (QID) | ORAL | Status: DC | PRN

## 2016-11-20 MED ORDER — LIDOCAINE HCL (CARDIAC) 20 MG/ML IV SOLN
INTRAVENOUS | Status: DC | PRN
  Administered 2016-11-20: 60 mg via INTRAVENOUS

## 2016-11-20 MED ORDER — CIPROFLOXACIN IN D5W 400 MG/200ML IV SOLN
INTRAVENOUS | Status: AC
  Filled 2016-11-20: qty 200

## 2016-11-20 MED ORDER — ONDANSETRON HCL 4 MG/2ML IJ SOLN
INTRAMUSCULAR | Status: AC
  Filled 2016-11-20: qty 2

## 2016-11-20 MED ORDER — SODIUM CHLORIDE 0.9 % IJ SOLN
INTRAMUSCULAR | Status: AC
  Filled 2016-11-20: qty 20

## 2016-11-20 MED ORDER — LACTATED RINGERS IR SOLN
Status: DC | PRN
Start: 2016-11-20 — End: 2016-11-20
  Administered 2016-11-20: 1000 mL

## 2016-11-20 MED ORDER — LIDOCAINE 2% (20 MG/ML) 5 ML SYRINGE
INTRAMUSCULAR | Status: AC
  Filled 2016-11-20: qty 5

## 2016-11-20 MED ORDER — FENTANYL CITRATE (PF) 100 MCG/2ML IJ SOLN
INTRAMUSCULAR | Status: DC | PRN
  Administered 2016-11-20: 100 ug via INTRAVENOUS
  Administered 2016-11-20: 50 ug via INTRAVENOUS
  Administered 2016-11-20: 100 ug via INTRAVENOUS

## 2016-11-20 MED ORDER — HYDROMORPHONE HCL 1 MG/ML IJ SOLN
0.2500 mg | INTRAMUSCULAR | Status: DC | PRN
  Administered 2016-11-20 (×4): 0.5 mg via INTRAVENOUS

## 2016-11-20 MED ORDER — MEPERIDINE HCL 50 MG/ML IJ SOLN: 6.2500 mg | INTRAMUSCULAR | Status: DC | PRN

## 2016-11-20 MED ORDER — DEXAMETHASONE SODIUM PHOSPHATE 10 MG/ML IJ SOLN
INTRAMUSCULAR | Status: DC | PRN
  Administered 2016-11-20: 10 mg via INTRAVENOUS

## 2016-11-20 SURGICAL SUPPLY — 61 items
BAG LAPAROSCOPIC 12 15 PORT 16 (BASKET) ×1 IMPLANT
BAG RETRIEVAL 12/15 (BASKET) ×2
BAG RETRIEVAL 12/15MM (BASKET) ×1
CHLORAPREP W/TINT 26ML (MISCELLANEOUS) ×3 IMPLANT
CLIP LIGATING HEM O LOK PURPLE (MISCELLANEOUS) ×3 IMPLANT
CLIP LIGATING HEMO LOK XL GOLD (MISCELLANEOUS) ×3 IMPLANT
CLIP LIGATING HEMO O LOK GREEN (MISCELLANEOUS) ×3 IMPLANT
COVER SURGICAL LIGHT HANDLE (MISCELLANEOUS) ×3 IMPLANT
COVER TIP SHEARS 8 DVNC (MISCELLANEOUS) ×1 IMPLANT
COVER TIP SHEARS 8MM DA VINCI (MISCELLANEOUS) ×2
DECANTER SPIKE VIAL GLASS SM (MISCELLANEOUS) ×3 IMPLANT
DERMABOND ADVANCED (GAUZE/BANDAGES/DRESSINGS) ×2
DERMABOND ADVANCED .7 DNX12 (GAUZE/BANDAGES/DRESSINGS) ×1 IMPLANT
DRAIN CHANNEL 15F RND FF 3/16 (WOUND CARE) IMPLANT
DRAPE ARM DVNC X/XI (DISPOSABLE) ×4 IMPLANT
DRAPE COLUMN DVNC XI (DISPOSABLE) ×1 IMPLANT
DRAPE DA VINCI XI ARM (DISPOSABLE) ×8
DRAPE DA VINCI XI COLUMN (DISPOSABLE) ×2
DRAPE INCISE IOBAN 66X45 STRL (DRAPES) ×3 IMPLANT
DRAPE LAPAROSCOPIC ABDOMINAL (DRAPES) ×3 IMPLANT
DRAPE SHEET LG 3/4 BI-LAMINATE (DRAPES) ×3 IMPLANT
ELECT PENCIL ROCKER SW 15FT (MISCELLANEOUS) ×3 IMPLANT
ELECT REM PT RETURN 15FT ADLT (MISCELLANEOUS) ×3 IMPLANT
EVACUATOR SILICONE 100CC (DRAIN) IMPLANT
GLOVE BIO SURGEON STRL SZ 6.5 (GLOVE) IMPLANT
GLOVE BIO SURGEONS STRL SZ 6.5 (GLOVE)
GLOVE BIOGEL M STRL SZ7.5 (GLOVE) IMPLANT
GOWN STRL REUS W/TWL LRG LVL3 (GOWN DISPOSABLE) ×18 IMPLANT
IRRIG SUCT STRYKERFLOW 2 WTIP (MISCELLANEOUS) ×3
IRRIGATION SUCT STRKRFLW 2 WTP (MISCELLANEOUS) ×1 IMPLANT
KIT BASIN OR (CUSTOM PROCEDURE TRAY) ×3 IMPLANT
LOOP VESSEL MAXI BLUE (MISCELLANEOUS) ×3 IMPLANT
NEEDLE INSUFFLATION 14GA 120MM (NEEDLE) ×3 IMPLANT
PORT ACCESS TROCAR AIRSEAL 12 (TROCAR) ×1 IMPLANT
PORT ACCESS TROCAR AIRSEAL 5M (TROCAR) ×2
POSITIONER SURGICAL ARM (MISCELLANEOUS) ×6 IMPLANT
RELOAD STAPLER WHITE 60MM (STAPLE) ×4 IMPLANT
SEAL CANN UNIV 5-8 DVNC XI (MISCELLANEOUS) ×4 IMPLANT
SEAL XI 5MM-8MM UNIVERSAL (MISCELLANEOUS) ×8
SET TRI-LUMEN FLTR TB AIRSEAL (TUBING) ×3 IMPLANT
SOLUTION ELECTROLUBE (MISCELLANEOUS) ×3 IMPLANT
SPONGE LAP 4X18 X RAY DECT (DISPOSABLE) ×3 IMPLANT
STAPLE ECHEON FLEX 60 POW ENDO (STAPLE) ×3 IMPLANT
STAPLER RELOAD WHITE 60MM (STAPLE) ×12
SUT ETHILON 3 0 PS 1 (SUTURE) IMPLANT
SUT MNCRL AB 4-0 PS2 18 (SUTURE) ×6 IMPLANT
SUT PDS AB 1 CT1 27 (SUTURE) ×9 IMPLANT
SUT VIC AB 2-0 SH 27 (SUTURE) ×2
SUT VIC AB 2-0 SH 27X BRD (SUTURE) ×1 IMPLANT
SUT VIC AB 3-0 SH 27 (SUTURE) ×2
SUT VIC AB 3-0 SH 27X BRD (SUTURE) ×1 IMPLANT
SUT VICRYL 0 UR6 27IN ABS (SUTURE) ×3 IMPLANT
TAPE STRIPS DRAPE STRL (GAUZE/BANDAGES/DRESSINGS) IMPLANT
TOWEL OR 17X26 10 PK STRL BLUE (TOWEL DISPOSABLE) ×3 IMPLANT
TOWEL OR NON WOVEN STRL DISP B (DISPOSABLE) ×3 IMPLANT
TRAY FOLEY W/METER SILVER 16FR (SET/KITS/TRAYS/PACK) ×3 IMPLANT
TRAY LAPAROSCOPIC (CUSTOM PROCEDURE TRAY) ×3 IMPLANT
TROCAR BLADELESS OPT 12M 100M (ENDOMECHANICALS) ×3 IMPLANT
TROCAR BLADELESS OPT 5 100 (ENDOMECHANICALS) ×3 IMPLANT
TROCAR XCEL 12X100 BLDLESS (ENDOMECHANICALS) ×3 IMPLANT
WATER STERILE IRR 1500ML POUR (IV SOLUTION) IMPLANT

## 2016-11-20 NOTE — Discharge Instructions (Signed)

## 2016-11-20 NOTE — Brief Op Note (Signed)
11/20/2016  10:44 AM  PATIENT:  [name]  56 y.o. female  PRE-OPERATIVE DIAGNOSIS:  RIGHT RENAL  AND ADRENAL MASS  POST-OPERATIVE DIAGNOSIS:  RIGHT RENAL  AND ADRENAL MASS  PROCEDURE:  Procedure(s): XI ROBOTIC ASSISTED LAPAROSCOPIC NEPHRECTOMY (Right)  SURGEON:  Surgeon(s) and Role:    * Alexis Frock, MD - Primary  PHYSICIAN ASSISTANT:   ASSISTANTS: Debbrah Alar PA   ANESTHESIA:   local and general  EBL:  Total I/O In: 1200 [I.V.:1200] Out: 300 [Urine:200; Blood:100]  BLOOD ADMINISTERED:none  DRAINS: none   LOCAL MEDICATIONS USED:  MARCAINE     SPECIMEN:  Source of Specimen:  Rt kidney + total adrenal  DISPOSITION OF SPECIMEN:  PATHOLOGY  COUNTS:  YES  TOURNIQUET:  * No tourniquets in log *  DICTATION: .Other Dictation: Dictation Number [id number]  PLAN OF CARE: Admit to inpatient   PATIENT DISPOSITION:  PACU - hemodynamically stable.   Delay start of Pharmacological VTE agent (>24hrs) due to surgical blood loss or risk of bleeding: yes

## 2016-11-20 NOTE — Anesthesia Procedure Notes (Signed)
Procedure Name: Intubation Date/Time: 11/20/2016 8:40 AM Performed by: Glory Buff Pre-anesthesia Checklist: Patient identified, Emergency Drugs available, Suction available and Patient being monitored Patient Re-evaluated:Patient Re-evaluated prior to inductionOxygen Delivery Method: Circle system utilized Preoxygenation: Pre-oxygenation with 100% oxygen Intubation Type: IV induction Ventilation: Mask ventilation without difficulty Laryngoscope Size: Miller and 3 Grade View: Grade I Tube type: Oral Tube size: 7.5 mm Number of attempts: 1 Airway Equipment and Method: Stylet and Oral airway Placement Confirmation: ETT inserted through vocal cords under direct vision,  positive ETCO2 and breath sounds checked- equal and bilateral Secured at: 21 cm Tube secured with: Tape Dental Injury: Teeth and Oropharynx as per pre-operative assessment

## 2016-11-20 NOTE — Anesthesia Preprocedure Evaluation (Addendum)
Anesthesia Evaluation  Patient identified by MRN, date of birth, ID band Patient awake    Reviewed: Allergy & Precautions, NPO status , Patient's Chart, lab work & pertinent test results  Airway Mallampati: II  TM Distance: >3 FB Neck ROM: Full    Dental no notable dental hx. (+) Teeth Intact,    Pulmonary asthma ,  Pulmonary nodule Chronic cough   Pulmonary exam normal breath sounds clear to auscultation       Cardiovascular hypertension, Pt. on medications Normal cardiovascular exam Rhythm:Regular Rate:Normal     Neuro/Psych PSYCHIATRIC DISORDERS Depression Restless legs syndrome    GI/Hepatic negative GI ROS, Neg liver ROS,   Endo/Other  Obesity Right adrenal mass  Renal/GU Right renal mass  negative genitourinary   Musculoskeletal   Abdominal (+) + obese,   Peds  Hematology  (+) anemia ,   Anesthesia Other Findings   Reproductive/Obstetrics                              Chemistry      Component Value Date/Time   NA 139 11/18/2016 1047   K 4.0 11/18/2016 1047   CL 102 11/18/2016 1047   CO2 28 11/18/2016 1047   BUN 16 11/18/2016 1047   CREATININE 0.68 11/18/2016 1047      Component Value Date/Time   CALCIUM 9.4 11/18/2016 1047   ALKPHOS 74 07/27/2016 0357   AST 32 07/27/2016 0357   ALT 28 07/27/2016 0357   BILITOT 0.7 07/27/2016 0357     Lab Results  Component Value Date   WBC 9.3 11/18/2016   HGB 13.7 11/18/2016   HCT 41.3 11/18/2016   MCV 84.1 11/18/2016   PLT 318 11/18/2016   EKG: unchanged from previous tracings, normal sinus rhythm.  Anesthesia Physical Anesthesia Plan  ASA: III  Anesthesia Plan: General   Post-op Pain Management:    Induction: Intravenous  Airway Management Planned: Oral ETT  Additional Equipment: Arterial line  Intra-op Plan:   Post-operative Plan: Extubation in OR  Informed Consent: I have reviewed the patients History and  Physical, chart, labs and discussed the procedure including the risks, benefits and alternatives for the proposed anesthesia with the patient or authorized representative who has indicated his/her understanding and acceptance.   Dental advisory given  Plan Discussed with: CRNA, Anesthesiologist and Surgeon  Anesthesia Plan Comments:         Anesthesia Quick Evaluation

## 2016-11-20 NOTE — Anesthesia Procedure Notes (Signed)
Arterial Line Insertion Start/End5/08/2016 8:55 AM, 11/20/2016 8:57 AM Performed by: Josephine Igo, anesthesiologist  Patient location: OR. Preanesthetic checklist: patient identified, IV checked, site marked, risks and benefits discussed, surgical consent, monitors and equipment checked, pre-op evaluation, timeout performed and anesthesia consent Left, radial was placed Catheter size: 18 G Hand hygiene performed , maximum sterile barriers used  and Seldinger technique used Allen's test indicative of satisfactory collateral circulation Attempts: 1 (Dr Royce Macadamia x 1 stick after 2 attempts by G Johndaniel Catlin CRNA) Procedure performed without using ultrasound guided technique. Following insertion, dressing applied and Biopatch. Post procedure assessment: normal and unchanged  Patient tolerated the procedure well with no immediate complications.

## 2016-11-20 NOTE — H&P (Signed)
Jordan Harmon is an 56 y.o. female.    Chief Complaint: Pre-op RIGHT Robotic Radical Nephrectomy  HPI:   1 - Right Solid Renal and Adrenal Masses - 3.5cm solid posterior mid mass incidental on CT 07/2016 during admission for food poisening. Ipsilateral 1.5cm adrenal mass as well. Renal lesion is 70% endophytic, posterior mid, and infiltrates renal sinus fat (?T3). CT chest unremarkable. CMP, metanephrines and cortisol normal, Cr <1.0. No h/o hypokalemia or severe hypertension. 1 artery (early branching) / 1 vein right renovascular anatomy. No non-contrast phases to verify true level of enhancment.   Dedicated renal MRI confirms concerning right renal lesion and left cyst non-complex.    PMH sig for mild obesity, HTN, benign hyst. Her PCP is Particia Nearing PA.   Today "Jordan Harmon" is seen to proceed with RIGHT robotic radical nephrectomy with ipsilateral adrenalaectomy.    Past Medical History:  Diagnosis Date  . Adrenal nodule ([place])   . Anemia   . Asthma    as child  . Depression   . Hypertension   . Obesity   . Pulmonary nodule   . Renal mass   . Restless leg syndrome     Past Surgical History:  Procedure Laterality Date  . ABDOMINAL HYSTERECTOMY    . NEPHRECTOMY     11/20/16  . TUBAL LIGATION      Family History  Problem Relation Age of Onset  . Diabetes Mother   . Heart disease Mother    Social History:  reports that she has never smoked. She has never used smokeless tobacco. She reports that she does not drink alcohol or use drugs.  Allergies:  Allergies  Allergen Reactions  . Penicillins Anaphylaxis    Has patient had a PCN reaction causing immediate rash, facial/tongue/throat swelling, SOB or lightheadedness with hypotension: Yes Has patient had a PCN reaction causing severe rash involving mucus membranes or skin necrosis: No Has patient had a PCN reaction that required hospitalization Yes Has patient had a PCN reaction occurring within the last 10 years: No If all  of the above answers are "NO", then may proceed with Cephalosporin use.   Diona Fanti [Aspirin] Hives  . Latex Rash    No prescriptions prior to admission.    Results for orders placed or performed during the hospital encounter of 11/18/16 (from the past 48 hour(s))  Basic metabolic panel     Status: None   Collection Time: 11/18/16 10:47 AM  Result Value Ref Range   Sodium 139 135 - 145 mmol/L   Potassium 4.0 3.5 - 5.1 mmol/L   Chloride 102 101 - 111 mmol/L   CO2 28 22 - 32 mmol/L   Glucose, Bld 89 65 - 99 mg/dL   BUN 16 6 - 20 mg/dL   Creatinine, Ser 0.68 0.44 - 1.00 mg/dL   Calcium 9.4 8.9 - 10.3 mg/dL   GFR calc non Af Amer >60 >60 mL/min   GFR calc Af Amer >60 >60 mL/min    Comment: (NOTE) The eGFR has been calculated using the CKD EPI equation. This calculation has not been validated in all clinical situations. eGFR's persistently <60 mL/min signify possible Chronic Kidney Disease.    Anion gap 9 5 - 15  CBC     Status: None   Collection Time: 11/18/16 10:47 AM  Result Value Ref Range   WBC 9.3 4.0 - 10.5 K/uL   RBC 4.91 3.87 - 5.11 MIL/uL   Hemoglobin 13.7 12.0 - 15.0 g/dL  HCT 41.3 36.0 - 46.0 %   MCV 84.1 78.0 - 100.0 fL   MCH 27.9 26.0 - 34.0 pg   MCHC 33.2 30.0 - 36.0 g/dL   RDW 13.2 11.5 - 15.5 %   Platelets 318 150 - 400 K/uL  Type and screen All Cardiac and thoracic surgeries, spinal fusions, myomectomies, craniotomies, colon & liver resections, total joint revisions, same day c-section with placenta previa or accreta.     Status: None   Collection Time: 11/18/16 10:47 AM  Result Value Ref Range   ABO/RH(D) O POS    Antibody Screen NEG    Sample Expiration 12/02/2016    Extend sample reason NO TRANSFUSIONS OR PREGNANCY IN THE PAST 3 MONTHS   ABO/Rh     Status: None   Collection Time: 11/18/16 10:47 AM  Result Value Ref Range   ABO/RH(D) O POS    No results found.  Review of Systems  Constitutional: Negative.  Negative for fever.  HENT: Negative.    Eyes: Negative.   Respiratory: Negative.   Cardiovascular: Negative.   Gastrointestinal: Negative.   Genitourinary: Negative.  Negative for flank pain.  Musculoskeletal: Negative.   Skin: Negative.   Neurological: Negative.   Endo/Heme/Allergies: Negative.   Psychiatric/Behavioral: Negative.     There were no vitals taken for this visit. Physical Exam  Constitutional: She is oriented to person, place, and time. She appears well-developed.  HENT:  Head: Normocephalic.  Eyes: Pupils are equal, round, and reactive to light.  Neck: Normal range of motion.  Cardiovascular: Normal rate.   Respiratory: Effort normal.  GI: Soft.  Genitourinary:  Genitourinary Comments: NO CVAT  Musculoskeletal: Normal range of motion.  Neurological: She is alert and oriented to person, place, and time.  Skin: Skin is warm.  Psychiatric: She has a normal mood and affect.     Assessment/Plan  1 - Right Solid Renal and Adrenal Masses - proceed as planned with RIGHT robotic radical nephrectomy / adrenalaectomy. Risks, benefits, alternatives, expected peri-op course discussed previously and reiterated today.   Alexis Frock, MD 11/20/2016, 5:38 AM

## 2016-11-20 NOTE — Transfer of Care (Signed)
Immediate Anesthesia Transfer of Care Note  Patient: Jordan Harmon  Procedure(s) Performed: Procedure(s): XI ROBOTIC ASSISTED LAPAROSCOPIC NEPHRECTOMY AND  TOTAL ADRENALECTOMY (Right)  Patient Location: PACU  Anesthesia Type:General  Level of Consciousness:  sedated, patient cooperative and responds to stimulation  Airway & Oxygen Therapy:Patient Spontanous Breathing and Patient connected to face mask oxgen  Post-op Assessment:  Report given to PACU RN and Post -op Vital signs reviewed and stable  Post vital signs:  Reviewed and stable  Last Vitals:  Vitals:   11/20/16 0625  BP: (!) 147/98  Pulse: 72  Resp: 16  Temp: 03.8 C    Complications: No apparent anesthesia complications

## 2016-11-20 NOTE — Anesthesia Procedure Notes (Addendum)
Arterial Line Insertion Start/End5/08/2016 9:02 AM, 11/20/2016 9:04 AM Performed by: Josephine Igo, anesthesiologist  Patient location: OR. Preanesthetic checklist: patient identified, IV checked, site marked, risks and benefits discussed, surgical consent, monitors and equipment checked, pre-op evaluation, timeout performed and anesthesia consent Left, radial was placed Catheter size: 20 G Hand hygiene performed  and maximum sterile barriers used  Allen's test indicative of satisfactory collateral circulation Attempts: 1 Procedure performed without using ultrasound guided technique. Ultrasound Notes:anatomy identified Following insertion, dressing applied and Biopatch. Patient tolerated the procedure well with no immediate complications.

## 2016-11-20 NOTE — Interval H&P Note (Signed)
History and Physical Interval Note:  11/20/2016 8:14 AM  Jordan Harmon  has presented today for surgery, with the diagnosis of RIGHT RENAL  AND ADRENAL MASS  The various methods of treatment have been discussed with the patient and family. After consideration of risks, benefits and other options for treatment, the patient has consented to  Procedure(s): XI ROBOTIC ASSISTED LAPAROSCOPIC NEPHRECTOMY (Right) as a surgical intervention .  The patient's history has been reviewed, patient examined, no change in status, stable for surgery.  I have reviewed the patient's chart and labs.  Questions were answered to the patient's satisfaction.     [name]

## 2016-11-21 ENCOUNTER — Encounter (HOSPITAL_COMMUNITY): Payer: Self-pay | Admitting: Urology

## 2016-11-21 LAB — BASIC METABOLIC PANEL
ANION GAP: 7 (ref 5–15)
BUN: 10 mg/dL (ref 6–20)
CALCIUM: 8.8 mg/dL — AB (ref 8.9–10.3)
CO2: 27 mmol/L (ref 22–32)
CREATININE: 1.17 mg/dL — AB (ref 0.44–1.00)
Chloride: 104 mmol/L (ref 101–111)
GFR, EST AFRICAN AMERICAN: 59 mL/min — AB (ref >60)
GFR, EST NON AFRICAN AMERICAN: 51 mL/min — AB (ref >60)
Glucose, Bld: 117 mg/dL — ABNORMAL HIGH (ref 65–99)
Potassium: 3.9 mmol/L (ref 3.5–5.1)
SODIUM: 138 mmol/L (ref 135–145)

## 2016-11-21 LAB — HEMOGLOBIN AND HEMATOCRIT, BLOOD
HEMATOCRIT: 36.7 % (ref 36.0–46.0)
HEMOGLOBIN: 12.1 g/dL (ref 12.0–15.0)

## 2016-11-21 MED ORDER — OXYCODONE HCL 5 MG PO TABS: 5.0000 mg | ORAL_TABLET | Freq: Four times a day (QID) | ORAL | 0 refills | Status: DC | PRN

## 2016-11-21 MED ORDER — DOCUSATE SODIUM 100 MG PO CAPS: 100.0000 mg | ORAL_CAPSULE | Freq: Every day | ORAL | 99 refills | Status: DC

## 2016-11-21 MED ORDER — SENNA 8.6 MG PO TABS: 1.0000 | ORAL_TABLET | Freq: Every day | ORAL | 99 refills | Status: DC

## 2016-11-21 NOTE — Progress Notes (Signed)
Urology Progress Note  1 Day Post-Op  Subjective: The patient is doing well.  No nausea or vomiting. Pain is adequately controlled.  Objective: Vital signs in last 24 hours: Temp:  [97.5 F (36.4 C)-99.1 F (37.3 C)] 98.8 F (37.1 C) (05/03 0510) Pulse Rate:  [70-89] 71 (05/03 0510) Resp:  [10-18] 18 (05/03 0510) BP: (109-133)/(70-81) 109/72 (05/03 0510) SpO2:  [96 %-100 %] 100 % (05/03 0510) Arterial Line BP: (97-109)/(40-60) 109/60 (05/02 1115)  Intake/Output from previous day: 05/02 0701 - 05/03 0700 In: 3183.3 [I.V.:3183.3] Out: 2425 [Urine:2325; Blood:100] Intake/Output this shift: No intake/output data recorded.  Physical Exam:  General: Alert and oriented. CV: RRR Lungs: Clear bilaterally. GI: Soft, Nondistended. Incisions: Clean, dry, and intact Urine: Clear Extremities: Nontender, no erythema, no edema.  Lab Results:  Recent Labs  11/18/16 1047 11/20/16 1107 11/21/16 0606  HGB 13.7 12.2 12.1  HCT 41.3 37.3 36.7      Assessment/Plan: POD#1 s/p robotic left radical nephrectomy  1. D/c foley 2. ML IV fluids, advance regular diet 3. Ambulation 4. Likely d/c later today   LOS: 1 day   LOMBOY, JASON R 11/21/2016, 7:05 AM    I have seen and examined the patient and agree with assesment and plan as outlinted here and in DC summary that I authored from same date.

## 2016-11-21 NOTE — Op Note (Signed)
NAME:  Jordan Harmon NO.:  MEDICAL RECORD NO.:  [id number]  LOCATION:                                 FACILITY:  PHYSICIAN:  Alexis Frock, MD     DATE OF BIRTH:  1961/01/22  DATE OF PROCEDURE: 11/20/2016                              OPERATIVE REPORT   DIAGNOSIS:  Right renal and adrenal mass.  PROCEDURE:  Robotic-assisted right radical nephrectomy with total adrenalectomy.  ESTIMATED BLOOD LOSS:  Less than 100 mL.  COMPLICATION:  None.  SPECIMENS:  Right kidney with total adrenal en bloc.  FINDINGS:  Early branching artery, single vein right renovascular anatomy.  INDICATION:  Jordan Harmon is a very pleasant 56 year old lady who was found incidentally to have a 3.5-cm quite endophytic lower pole renal mass and this encroached the area of the hilum and collecting system throughout a broad surface.  She also has an ipsilateral adrenal mass and a small contralateral renal cyst.  Her kidney function is normal. Metastatic survey is unremarkable.  She underwent MRI, which corroborated left cyst noncomplex, but right side worrisome for primary renal cell carcinoma and possible oligometastatic disease in the ipsilateral adrenal.  Options were discussed for further management including right radical nephrectomy with ipsilateral adrenalectomy with curative intent.  She wished to proceed.  Informed consent was obtained and placed in the medical record.  PROCEDURE IN DETAIL:  The patient being Jordan Harmon, was verified. Procedure being right robotic radical nephrectomy was confirmed. Procedure was carried out.  Time-out was performed.  Intravenous antibiotics were administered.  General endotracheal anesthesia was introduced.  The patient was placed into a right side up full flank position, applying 15 degrees of stable flexion, superior arm elevator, axillary roll, sequential compression devices, bottom leg bent, top leg straight.  She was further  fashioned to the operating table using 3-inch tape over foam padding across her supraxiphoid chest and her pelvis. Sterile field was created using chlorhexidine gluconate and a high-flow, low pressure pneumoperitoneum was obtained using Veress technique in the right lower quadrant having passed the aspiration and drop test.  Next, an 8-mm robotic camera port was placed and positioned approximately 1 handbreadth inferior to the costal margin.  Laparoscopic examination of the peritoneal cavity revealed no significant adhesions and no visceral injury.  Next, a 5-mm liver retraction port was placed in the subxiphoid location in the midline, through which, a self-locking grasper was used to elevate the inferior surface of the liver, superiorly off the anterior surface of Gerota's fascia.  Additional ports were then placed as follows:  8-mm subcostal robotic port, 8-mm robotic port approximately 4 fingerbreadths superomedial to the anterior superior iliac spine, 8-mm inferior paramedian robotic port approximately 1 handbreadth superior to the pubic ramus and two 12-mm assistant ports in the midline, one in the infraumbilical crease and another approximately 2 fingerbreadths above the camera port.  Robot was docked and passed through the electronic checks.  Initial attention was directed at development of retroperitoneum.  Incision was made lateral to the ascending colon from the area of the cecum towards the area of the hepatic flexure that was carefully mobilized medially.  Lower pole  of kidney area was identified, placed on gentle lateral traction.  The duodenal was encountered and very carefully kocherized medially, set lie completely medial to the lateral surface of the inferior vena cava. Dissection was then proceeded medially just inferior to lower surface of the kidney and the ureter and gonadal vessels were encountered.  The ureter was swept laterally to the gonadal vessels, which were  then swept medially and dissection proceeded within this triangle towards the area of the renal hilum.  Renal hilum consisted of a single early-branching artery, single vein renovascular anatomy as anticipated given the level of early branching, the artery was controlled using two separate extra- large Hem-O-Lok clips on the proximal end of the superior and inferior branch respectively followed by vascular stapler distally to each segment and the vein was controlled using vascular load stapler.  This resulted in excellent hemostatic control of the hilum.  Given the ipsilateral adrenal lesion, dissection was then performed directly on the surface laterally of the inferior vena cava towards the area of the superior most of the liver and apex of the adrenal and the medial plane was carefully dissected free using point coagulation current on the medial aspect.  Superior renal attachments were taken down using combination of bipolar energy and very careful scissor dissection. Next, the lateral attachments were taken down, the ureter was doubly clipped and ligated.  This completely freed up the right radical nephrectomy and total adrenalectomy specimen en bloc.  This was placed into an extra-large EndoCatch bag for later retrieval.  All sponge, needle and instrument counts were correct.  Hemostasis was excellent. Specimen was retrieved by extending the previous superior robotic port site for total distance approximately 5 cm removing the right kidney specimen, setting it aside for permanent pathology.  The extraction site was closed at the level of the fascia of figure-of-eight PDS x4.  The inferior-most assistant port site was closed at the level of the fascia using 2-0 Vicryl.  All incision sites were infiltrated with dilute lyophilized Marcaine.  The extraction site was further closed at the level of the Scarpa's using running Vicryl and all incision sites were closed at the level of the  skin using subcuticular Monocryl followed by Dermabond, procedure was terminated.  The patient tolerated the procedure well.  There were no immediate periprocedural complications. The patient was taken to the postanesthesia care unit in stable condition.          ______________________________ Alexis Frock, MD     TM/MEDQ  D:  11/20/2016  T:  11/20/2016  Job:  [id number]

## 2016-11-21 NOTE — Care Management Note (Signed)
Case Management Note  Patient Details  Name: RITIKA HELLICKSON MRN: [id number] Date of Birth: [birth year]  Subjective/Objective: 56 y/o f admitted w/renal mass. s/p R rad nephrectomy. From home. Provided patient w/pcp listing, [facility] med list. Patient voiced understanding.                  Action/Plan:d/c plan home.   Expected Discharge Date:                  Expected Discharge Plan:  Home/Self Care  In-House Referral:     Discharge planning Services  CM Consult  Post Acute Care Choice:    Choice offered to:     DME Arranged:    DME Agency:     HH Arranged:    HH Agency:     Status of Service:  In process, will continue to follow  If discussed at Long Length of Stay Meetings, dates discussed:    Additional Comments:  Dessa Phi, RN 11/21/2016, 12:09 PM

## 2016-11-21 NOTE — Progress Notes (Signed)
Patient accompanied home by daughter. Patient denies any pain/distress, transported to the car by staff via wheelchair.

## 2016-11-21 NOTE — Discharge Summary (Signed)
Physician Discharge Summary  Patient ID: Jordan Harmon MRN: [id number] DOB/AGE: 1961-06-06 56 y.o.  Admit date: 11/20/2016 Discharge date: 11/21/2016  Admission Diagnoses: RIGHT Renal and Adrenal Masses  Discharge Diagnoses:  Active Problems:   Renal mass RIGHT Renal and Adrenal Masses  Discharged Condition: good  Hospital Course: Pt underwent RIGHT robotic radical nephrectomy with total adrenalectomy on 11/20/16, the day of admission, without acute complications. She was admitted post-op. By the afternoon of POD 1, she is ambulatory, tollerating PO intake, pain controlled with PO meds, voiding to completion, and felt to be adequate for discharge. Hgb 12.1, Cr 1.17, pathology pending at discharge.   Consults: None  Significant Diagnostic Studies: labs: as per above  Treatments: surgery: as per above  Discharge Exam: Blood pressure 133/68, pulse 72, temperature 98.7 F (37.1 C), temperature source Oral, resp. rate 18, height 5\' 7"  (1.702 m), weight 103.4 kg (228 lb), SpO2 99 %. General appearance: alert, cooperative and appears stated age Eyes: negative Nose: Nares normal. Septum midline. Mucosa normal. No drainage or sinus tenderness. Throat: lips, mucosa, and tongue normal; teeth and gums normal Neck: supple, symmetrical, trachea midline Back: symmetric, no curvature. ROM normal. No CVA tenderness. Resp: non-labored on room air.  Cardio: Nl rate GI: soft, non-tender; bowel sounds normal; no masses,  no organomegaly Pelvic: external genitalia normal and foley now out Extremities: extremities normal, atraumatic, no cyanosis or edema Pulses: 2+ and symmetric Skin: Skin color, texture, turgor normal. No rashes or lesions Lymph nodes: Cervical, supraclavicular, and axillary nodes normal. Neurologic: Grossly normal Incision/Wound: recent port and extraction sites c/d/I.   Disposition: 01-Home or Self Care   Allergies as of 11/21/2016      Reactions   Penicillins Anaphylaxis   Has patient had a PCN reaction causing immediate rash, facial/tongue/throat swelling, SOB or lightheadedness with hypotension: Yes Has patient had a PCN reaction causing severe rash involving mucus membranes or skin necrosis: No Has patient had a PCN reaction that required hospitalization Yes Has patient had a PCN reaction occurring within the last 10 years: No If all of the above answers are "NO", then may proceed with Cephalosporin use.   Asa [aspirin] Hives   Latex Rash      Medication List    STOP taking these medications   multivitamin with minerals Tabs tablet   vitamin C 500 MG tablet Commonly known as:  ASCORBIC ACID     TAKE these medications   amLODipine 10 MG tablet Commonly known as:  NORVASC Take 1 tablet (10 mg total) by mouth daily.   docusate sodium 100 MG capsule Commonly known as:  COLACE Take 1-2 capsules (100-200 mg total) by mouth daily.   HYDROcodone-acetaminophen 5-325 MG tablet Commonly known as:  NORCO Take 1-2 tablets by mouth every 6 (six) hours as needed for moderate pain or severe pain.   lisinopril-hydrochlorothiazide 20-25 MG tablet Commonly known as:  PRINZIDE,ZESTORETIC Take 1 tablet by mouth daily.   loratadine 10 MG tablet Commonly known as:  CLARITIN Take 10 mg by mouth daily as needed for allergies.   senna 8.6 MG Tabs tablet Commonly known as:  SENOKOT Take 1-2 tablets (8.6-17.2 mg total) by mouth at bedtime.   traZODone 50 MG tablet Commonly known as:  DESYREL Take 1-2 tablets (50-100 mg total) by mouth at bedtime as needed for sleep.      Follow-up Information    Alexis Frock, MD Follow up on 12/05/2016.   Specialty:  Urology Why:  at 11:30 AM for  MD visit.  Contact information: [place] [address] [phone]           Signed: Alexis Frock 11/21/2016, 2:04 PM

## 2016-11-21 NOTE — Progress Notes (Signed)
Discharge instructions/prescription given and explained to patient. Surgical incisions clean/dry/intact, no signs of infection noted. Patient in no distress. Waiting on daughter to pick her up.

## 2016-11-22 NOTE — Anesthesia Postprocedure Evaluation (Signed)
Anesthesia Post Note  Patient: Jordan Harmon  Procedure(s) Performed: Procedure(s) (LRB): XI ROBOTIC ASSISTED LAPAROSCOPIC NEPHRECTOMY AND  TOTAL ADRENALECTOMY (Right)  Patient location during evaluation: PACU Anesthesia Type: General Level of consciousness: awake and alert Pain management: pain level controlled Vital Signs Assessment: post-procedure vital signs reviewed and stable Respiratory status: spontaneous breathing, nonlabored ventilation, respiratory function stable and patient connected to nasal cannula oxygen Cardiovascular status: blood pressure returned to baseline and stable Postop Assessment: no signs of nausea or vomiting Anesthetic complications: no                     Griffon Herberg A.

## 2017-01-04 ENCOUNTER — Emergency Department (HOSPITAL_COMMUNITY): Payer: BLUE CROSS/BLUE SHIELD

## 2017-01-04 ENCOUNTER — Encounter (HOSPITAL_COMMUNITY): Payer: Self-pay | Admitting: Emergency Medicine

## 2017-01-04 ENCOUNTER — Emergency Department (HOSPITAL_COMMUNITY)
Admission: EM | Admit: 2017-01-04 | Discharge: 2017-01-04 | Disposition: A | Discharge status: Home / Self Care | Payer: BLUE CROSS/BLUE SHIELD | Attending: Emergency Medicine | Admitting: Emergency Medicine

## 2017-01-04 DIAGNOSIS — R51 Headache: Secondary | ICD-10-CM | POA: Insufficient documentation

## 2017-01-04 DIAGNOSIS — I10 Essential (primary) hypertension: Secondary | ICD-10-CM | POA: Diagnosis not present

## 2017-01-04 DIAGNOSIS — R7989 Other specified abnormal findings of blood chemistry: Secondary | ICD-10-CM | POA: Insufficient documentation

## 2017-01-04 DIAGNOSIS — M5413 Radiculopathy, cervicothoracic region: Secondary | ICD-10-CM | POA: Insufficient documentation

## 2017-01-04 DIAGNOSIS — D649 Anemia, unspecified: Secondary | ICD-10-CM | POA: Insufficient documentation

## 2017-01-04 DIAGNOSIS — Z91040 Latex allergy status: Secondary | ICD-10-CM | POA: Diagnosis not present

## 2017-01-04 DIAGNOSIS — J45909 Unspecified asthma, uncomplicated: Secondary | ICD-10-CM | POA: Insufficient documentation

## 2017-01-04 DIAGNOSIS — M25511 Pain in right shoulder: Secondary | ICD-10-CM | POA: Diagnosis present

## 2017-01-04 DIAGNOSIS — Z79899 Other long term (current) drug therapy: Secondary | ICD-10-CM | POA: Insufficient documentation

## 2017-01-04 LAB — DIFFERENTIAL
BASOS PCT: 0 %
Basophils Absolute: 0 10*3/uL (ref 0.0–0.1)
Eosinophils Absolute: 0.3 10*3/uL (ref 0.0–0.7)
Eosinophils Relative: 2 %
LYMPHS PCT: 31 %
Lymphs Abs: 3.6 10*3/uL (ref 0.7–4.0)
Monocytes Absolute: 0.6 10*3/uL (ref 0.1–1.0)
Monocytes Relative: 5 %
NEUTROS ABS: 7.2 10*3/uL (ref 1.7–7.7)
Neutrophils Relative %: 62 %

## 2017-01-04 LAB — I-STAT CHEM 8, ED
BUN: 27 mg/dL — ABNORMAL HIGH (ref 6–20)
CALCIUM ION: 1.11 mmol/L — AB (ref 1.15–1.40)
CHLORIDE: 102 mmol/L (ref 101–111)
Creatinine, Ser: 1.6 mg/dL — ABNORMAL HIGH (ref 0.44–1.00)
Glucose, Bld: 125 mg/dL — ABNORMAL HIGH (ref 65–99)
HCT: 40 % (ref 36.0–46.0)
Hemoglobin: 13.6 g/dL (ref 12.0–15.0)
POTASSIUM: 3.7 mmol/L (ref 3.5–5.1)
SODIUM: 140 mmol/L (ref 135–145)
TCO2: 26 mmol/L (ref 0–100)

## 2017-01-04 LAB — CBC
HCT: 40.3 % (ref 36.0–46.0)
Hemoglobin: 13.2 g/dL (ref 12.0–15.0)
MCH: 27.7 pg (ref 26.0–34.0)
MCHC: 32.8 g/dL (ref 30.0–36.0)
MCV: 84.5 fL (ref 78.0–100.0)
PLATELETS: 333 10*3/uL (ref 150–400)
RBC: 4.77 MIL/uL (ref 3.87–5.11)
RDW: 14 % (ref 11.5–15.5)
WBC: 11.7 10*3/uL — AB (ref 4.0–10.5)

## 2017-01-04 LAB — COMPREHENSIVE METABOLIC PANEL
ALK PHOS: 83 U/L (ref 38–126)
ALT: 19 U/L (ref 14–54)
ANION GAP: 10 (ref 5–15)
AST: 25 U/L (ref 15–41)
Albumin: 3.8 g/dL (ref 3.5–5.0)
BUN: 24 mg/dL — ABNORMAL HIGH (ref 6–20)
CALCIUM: 9.1 mg/dL (ref 8.9–10.3)
CHLORIDE: 102 mmol/L (ref 101–111)
CO2: 25 mmol/L (ref 22–32)
CREATININE: 1.51 mg/dL — AB (ref 0.44–1.00)
GFR calc Af Amer: 44 mL/min — ABNORMAL LOW (ref >60)
GFR calc non Af Amer: 38 mL/min — ABNORMAL LOW (ref >60)
Glucose, Bld: 130 mg/dL — ABNORMAL HIGH (ref 65–99)
Potassium: 3.8 mmol/L (ref 3.5–5.1)
SODIUM: 137 mmol/L (ref 135–145)
Total Bilirubin: 0.3 mg/dL (ref 0.3–1.2)
Total Protein: 7.7 g/dL (ref 6.5–8.1)

## 2017-01-04 LAB — APTT: APTT: 32 s (ref 24–36)

## 2017-01-04 LAB — PROTIME-INR
INR: 0.94
PROTHROMBIN TIME: 12.6 s (ref 11.4–15.2)

## 2017-01-04 LAB — I-STAT TROPONIN, ED: Troponin i, poc: 0 ng/mL (ref 0.00–0.08)

## 2017-01-04 MED ORDER — LIDOCAINE 5 % EX PTCH
1.0000 | MEDICATED_PATCH | CUTANEOUS | Status: DC
  Administered 2017-01-04: 1 via TRANSDERMAL
  Filled 2017-01-04: qty 1

## 2017-01-04 MED ORDER — PREDNISONE 20 MG PO TABS: 40.0000 mg | ORAL_TABLET | Freq: Every day | ORAL | 0 refills | Status: DC

## 2017-01-04 MED ORDER — DEXAMETHASONE SODIUM PHOSPHATE 10 MG/ML IJ SOLN
10.0000 mg | Freq: Once | INTRAMUSCULAR | Status: AC
  Administered 2017-01-04: 10 mg via INTRAVENOUS
  Filled 2017-01-04: qty 1

## 2017-01-04 MED ORDER — SODIUM CHLORIDE 0.9 % IV BOLUS (SEPSIS)
1000.0000 mL | Freq: Once | INTRAVENOUS | Status: AC
  Administered 2017-01-04: 1000 mL via INTRAVENOUS

## 2017-01-04 MED ORDER — DIAZEPAM 5 MG PO TABS: 5.0000 mg | ORAL_TABLET | Freq: Two times a day (BID) | ORAL | 0 refills | Status: DC | PRN

## 2017-01-04 MED ORDER — METOCLOPRAMIDE HCL 5 MG/ML IJ SOLN
10.0000 mg | INTRAMUSCULAR | Status: AC
  Administered 2017-01-04: 10 mg via INTRAVENOUS
  Filled 2017-01-04: qty 2

## 2017-01-04 MED ORDER — LIDOCAINE 5 % EX PTCH: 1.0000 | MEDICATED_PATCH | CUTANEOUS | 0 refills | Status: DC

## 2017-01-04 NOTE — ED Notes (Signed)
ED Provider at bedside. 

## 2017-01-04 NOTE — ED Notes (Signed)
Patient transported to CT 

## 2017-01-04 NOTE — ED Notes (Signed)
Kelly, PA at the bedside.  

## 2017-01-04 NOTE — ED Provider Notes (Signed)
[facility] DEPT Provider Note   CSN: [id number] Arrival date & time: 01/04/17  1933     History   Chief Complaint Chief Complaint  Patient presents with  . Neck Pain  . Arm Pain    HPI Jordan Harmon is a 56 y.o. female.  56 year old female with a history of hypertension, renal mass status post nephrectomy, anemia, and depression presents to the emergency department for evaluation of right shoulder pain. She reports waking from sleep 2 days ago and feeling as though she "slept wrong" on her shoulder. She reports a constant pain which is worse with abduction of her shoulder. She has also experienced associated paresthesias in her right arm and hand. Patient denies taking any medications for symptoms. No direct fall, trauma, or injury. No associated fevers.  In triage, patient also reporting a right parietal headache, intermittent x 2 days. She describes the pain as "nagging and mild". She has had headaches in the past which are similar. She attributes onset of her headaches to a head injury 4 years ago. She has had associated blurry vision as well as dizziness. These symptoms are typically associated with her headaches. She had some relief of her headache yesterday after taking Aleve. No associated extremity weakness, vision loss, nausea, or vomiting.     Past Medical History:  Diagnosis Date  . Adrenal nodule ([place])   . Anemia   . Asthma    as child  . Depression   . Hypertension   . Obesity   . Pulmonary nodule   . Renal mass   . Restless leg syndrome     Patient Active Problem List   Diagnosis Date Noted  . Renal mass 11/20/2016  . Upper airway cough syndrome 09/03/2016  . Abnormal CT of the chest 09/03/2016  . Liver cyst 08/02/2016  . Intractable abdominal pain 07/27/2016  . Leukocytosis 07/27/2016  . Elevated lactic acid level 07/27/2016  . Abdominal pain 07/27/2016  . Nausea and vomiting 07/27/2016  . Restless leg syndrome   . Lung nodule seen on imaging  study   . Right kidney mass   . Adrenal mass ([place])   . Morbid obesity due to excess calories ([place])   . Essential hypertension 05/23/2016  . Bunion of great toe of left foot 05/23/2016  . Numbness of left foot 05/23/2016    Past Surgical History:  Procedure Laterality Date  . ABDOMINAL HYSTERECTOMY    . NEPHRECTOMY     11/20/16  . ROBOT ASSISTED LAPAROSCOPIC NEPHRECTOMY Right 11/20/2016   Procedure: XI ROBOTIC ASSISTED LAPAROSCOPIC NEPHRECTOMY AND  TOTAL ADRENALECTOMY;  Surgeon: Alexis Frock, MD;  Location: [facility]S;  Service: Urology;  Laterality: Right;  . TUBAL LIGATION      OB History    No data available       Home Medications    Prior to Admission medications   Medication Sig Start Date End Date Taking? Authorizing Provider  amLODipine (NORVASC) 10 MG tablet Take 1 tablet (10 mg total) by mouth daily. 07/29/16  Yes Thurnell Lose, MD  lisinopril-hydrochlorothiazide (PRINZIDE,ZESTORETIC) 20-25 MG tablet Take 1 tablet by mouth daily. 07/30/16  Yes Thurnell Lose, MD  loratadine (CLARITIN) 10 MG tablet Take 10 mg by mouth daily as needed for allergies.   Yes [provider]  naproxen sodium (ALEVE) 220 MG tablet Take 220-440 mg by mouth 2 (two) times daily as needed (for pain or headaches).   Yes [provider]  senna (SENOKOT) 8.6 MG  TABS tablet Take 1-2 tablets (8.6-17.2 mg total) by mouth at bedtime. 11/21/16  Yes Alexis Frock, MD  diazepam (VALIUM) 5 MG tablet Take 1 tablet (5 mg total) by mouth every 12 (twelve) hours as needed for muscle spasms. 01/04/17   Antonietta Breach, PA-C  docusate sodium (COLACE) 100 MG capsule Take 1-2 capsules (100-200 mg total) by mouth daily. Patient not taking: Reported on 01/04/2017 11/21/16   Alexis Frock, MD  HYDROcodone-acetaminophen [facility]) 5-325 MG tablet Take 1-2 tablets by mouth every 6 (six) hours as needed for moderate pain or severe pain. Patient not taking: Reported on 01/04/2017 11/20/16   Debbrah Alar, PA-C    lidocaine (LIDODERM) 5 % Place 1 patch onto the skin daily. Apply to area of pain/discomfort. Remove & Discard patch within 12 hours or as directed by MD 01/04/17   Antonietta Breach, PA-C  predniSONE (DELTASONE) 20 MG tablet Take 2 tablets (40 mg total) by mouth daily. Take 40 mg by mouth daily for 3 days, then 20mg  by mouth daily for 3 days, then 10mg  daily for 3 days 01/04/17   Antonietta Breach, PA-C  traZODone (DESYREL) 50 MG tablet Take 1-2 tablets (50-100 mg total) by mouth at bedtime as needed for sleep. Patient not taking: Reported on 01/04/2017 05/23/16   Terald Sleeper, PA-C    Family History Family History  Problem Relation Age of Onset  . Diabetes Mother   . Heart disease Mother     Social History Social History  Substance Use Topics  . Smoking status: Never Smoker  . Smokeless tobacco: Never Used  . Alcohol use No     Allergies   Penicillins; Asa [aspirin]; and Latex   Review of Systems Review of Systems Ten systems reviewed and are negative for acute change, except as noted in the HPI.    Physical Exam Updated Vital Signs BP 112/67   Pulse 93   Temp 98.4 F (36.9 C) (Oral)   Resp 16   SpO2 100%   Physical Exam  Constitutional: She is oriented to person, place, and time. She appears well-developed and well-nourished. No distress.  Nontoxic appearing and in NAD  HENT:  Head: Normocephalic and atraumatic.  Eyes: Conjunctivae and EOM are normal. No scleral icterus.  Neck: Normal range of motion.  No bony deformities, step-offs, or crepitus to the cervical midline. No meningismus.  Cardiovascular: Normal rate, regular rhythm and intact distal pulses.   Pulmonary/Chest: Effort normal. No respiratory distress. She has no wheezes.  Respirations even and unlabored  Musculoskeletal: Normal range of motion.       Right shoulder: She exhibits tenderness and pain. She exhibits no swelling, no deformity and no spasm.       Back:       Arms: Neurological: She is alert and  oriented to person, place, and time. No cranial nerve deficit. She exhibits normal muscle tone. Coordination normal.  GCS 15. Speech is goal oriented. No cranial nerve deficits appreciated; symmetric eyebrow raise, no facial drooping, tongue midline. Patient has equal grip strength bilaterally with 5/5 strength against resistance in all major muscle groups bilaterally. Sensation to light touch intact. Patient moves extremities without ataxia.  Skin: Skin is warm and dry. No rash noted. She is not diaphoretic. No erythema. No pallor.  Psychiatric: She has a normal mood and affect. Her behavior is normal.  Nursing note and vitals reviewed.    ED Treatments / Results  Labs (all labs ordered are listed, but only abnormal results are displayed)  Labs Reviewed  CBC - Abnormal; Notable for the following:       Result Value   WBC 11.7 (*)    All other components within normal limits  COMPREHENSIVE METABOLIC PANEL - Abnormal; Notable for the following:    Glucose, Bld 130 (*)    BUN 24 (*)    Creatinine, Ser 1.51 (*)    GFR calc non Af Amer 38 (*)    GFR calc Af Amer 44 (*)    All other components within normal limits  I-STAT CHEM 8, ED - Abnormal; Notable for the following:    BUN 27 (*)    Creatinine, Ser 1.60 (*)    Glucose, Bld 125 (*)    Calcium, Ion 1.11 (*)    All other components within normal limits  PROTIME-INR  APTT  DIFFERENTIAL  I-STAT TROPOININ, ED  CBG MONITORING, ED    EKG  EKG Interpretation None       Radiology Ct Head Wo Contrast  Result Date: 01/04/2017 CLINICAL DATA:  Acute onset of right neck pain and right arm numbness. Intermittent headache, blurred vision and dizziness. Concern for cervical spine injury. Initial encounter. EXAM: CT HEAD WITHOUT CONTRAST CT CERVICAL SPINE WITHOUT CONTRAST TECHNIQUE: Multidetector CT imaging of the head and cervical spine was performed following the standard protocol without intravenous contrast. Multiplanar CT image  reconstructions of the cervical spine were also generated. COMPARISON:  CT of the head performed 01/09/2015 FINDINGS: CT HEAD FINDINGS Brain: No evidence of acute infarction, hemorrhage, hydrocephalus, extra-axial collection or mass lesion/mass effect. The posterior fossa, including the cerebellum, brainstem and fourth ventricle, is within normal limits. The third and lateral ventricles, and basal ganglia are unremarkable in appearance. The cerebral hemispheres are symmetric in appearance, with normal gray-white differentiation. No mass effect or midline shift is seen. Vascular: No hyperdense vessel or unexpected calcification. Skull: There is no evidence of fracture; visualized osseous structures are unremarkable in appearance. Sinuses/Orbits: The orbits are within normal limits. The paranasal sinuses and mastoid air cells are well-aerated. Other: No significant soft tissue abnormalities are seen. CT CERVICAL SPINE FINDINGS Alignment: Normal. Skull base and vertebrae: No acute fracture. No primary bone lesion or focal pathologic process. A tiny degenerative osseous fragment is noted at the tip of the dens. Soft tissues and spinal canal: No prevertebral fluid or swelling. No visible canal hematoma. Disc levels: Intervertebral disc spaces are preserved. Small anterior and posterior disc osteophyte complexes are seen at C5-C6. Upper chest: Small vague hypodensity at the right thyroid lobe is likely benign, given its size. The visualized lung apices are grossly clear. Other: No additional soft tissue abnormalities are seen. IMPRESSION: 1. No evidence of traumatic intracranial injury or fracture. 2. No evidence of fracture or subluxation along the cervical spine. Electronically Signed   By: Garald Balding M.D.   On: 01/04/2017 21:34   Ct Cervical Spine Wo Contrast  Result Date: 01/04/2017 CLINICAL DATA:  Acute onset of right neck pain and right arm numbness. Intermittent headache, blurred vision and dizziness.  Concern for cervical spine injury. Initial encounter. EXAM: CT HEAD WITHOUT CONTRAST CT CERVICAL SPINE WITHOUT CONTRAST TECHNIQUE: Multidetector CT imaging of the head and cervical spine was performed following the standard protocol without intravenous contrast. Multiplanar CT image reconstructions of the cervical spine were also generated. COMPARISON:  CT of the head performed 01/09/2015 FINDINGS: CT HEAD FINDINGS Brain: No evidence of acute infarction, hemorrhage, hydrocephalus, extra-axial collection or mass lesion/mass effect. The posterior fossa, including the cerebellum, brainstem  and fourth ventricle, is within normal limits. The third and lateral ventricles, and basal ganglia are unremarkable in appearance. The cerebral hemispheres are symmetric in appearance, with normal gray-white differentiation. No mass effect or midline shift is seen. Vascular: No hyperdense vessel or unexpected calcification. Skull: There is no evidence of fracture; visualized osseous structures are unremarkable in appearance. Sinuses/Orbits: The orbits are within normal limits. The paranasal sinuses and mastoid air cells are well-aerated. Other: No significant soft tissue abnormalities are seen. CT CERVICAL SPINE FINDINGS Alignment: Normal. Skull base and vertebrae: No acute fracture. No primary bone lesion or focal pathologic process. A tiny degenerative osseous fragment is noted at the tip of the dens. Soft tissues and spinal canal: No prevertebral fluid or swelling. No visible canal hematoma. Disc levels: Intervertebral disc spaces are preserved. Small anterior and posterior disc osteophyte complexes are seen at C5-C6. Upper chest: Small vague hypodensity at the right thyroid lobe is likely benign, given its size. The visualized lung apices are grossly clear. Other: No additional soft tissue abnormalities are seen. IMPRESSION: 1. No evidence of traumatic intracranial injury or fracture. 2. No evidence of fracture or subluxation  along the cervical spine. Electronically Signed   By: Garald Balding M.D.   On: 01/04/2017 21:34    Procedures Procedures (including critical care time)  Medications Ordered in ED Medications  lidocaine (LIDODERM) 5 % 1 patch (1 patch Transdermal Patch Applied 01/04/17 2138)  sodium chloride 0.9 % bolus 1,000 mL (0 mLs Intravenous Stopped 01/04/17 2211)  dexamethasone (DECADRON) injection 10 mg (10 mg Intravenous Given 01/04/17 2134)  metoCLOPramide (REGLAN) injection 10 mg (10 mg Intravenous Given 01/04/17 2134)     Initial Impression / Assessment and Plan / ED Course  I have reviewed the triage vital signs and the nursing notes.  Pertinent labs & imaging results that were available during my care of the patient were reviewed by me and considered in my medical decision making (see chart for details).     56 year old female presents to the emergency department for evaluation of right upper extremity paresthesias as well as right shoulder pain. Pain is reproducible on palpation. It has been present for 2 days. Patient noticed it upon waking one morning. Patient has equal grip strength. No upper extremity weakness. Symptoms suspected to be secondary to peripheral radiculopathy.  Stroke order set was initiated in triage. Symptoms today are not consistent with TIA or CVA. Head and cervical spine CTs are reassuring. Patient was noted to have an increasing creatinine over the past 2 months. She reports history of nephrectomy which may be intervening to this value. I have expressed the need for patient to have this test rechecked by her primary care doctor. Patient verbalizes understanding.  Patient has had moderate improvement in pain with supportive measures in the emergency department. Will continue with outpatient management. Patient reports follow-up with her primary doctor in 2 days. Return precautions discussed and provided. Patient discharged in stable condition with no unaddressed  concerns.   Final Clinical Impressions(s) / ED Diagnoses   Final diagnoses:  Radiculopathy of cervicothoracic region  Elevated serum creatinine    New Prescriptions New Prescriptions   DIAZEPAM (VALIUM) 5 MG TABLET    Take 1 tablet (5 mg total) by mouth every 12 (twelve) hours as needed for muscle spasms.   LIDOCAINE (LIDODERM) 5 %    Place 1 patch onto the skin daily. Apply to area of pain/discomfort. Remove & Discard patch within 12 hours or as directed  by MD   PREDNISONE (DELTASONE) 20 MG TABLET    Take 2 tablets (40 mg total) by mouth daily. Take 40 mg by mouth daily for 3 days, then 20mg  by mouth daily for 3 days, then 10mg  daily for 3 days     Antonietta Breach, Hershal Coria 01/04/17 2323    Milton Ferguson, MD 01/04/17 2348

## 2017-01-04 NOTE — ED Triage Notes (Signed)
Pt presents to ED for assessment of right neck pain and right arm numbness, developing upon waking 2 days ago.  C/o intermittent headache with blurred vision and dizziness.  Patient denies sensation to the right arm upon palpation.  Grips equal, no other neuro deficits noted.

## 2017-01-06 ENCOUNTER — Encounter: Payer: Self-pay | Admitting: Physician Assistant

## 2017-01-06 ENCOUNTER — Ambulatory Visit (INDEPENDENT_AMBULATORY_CARE_PROVIDER_SITE_OTHER): Payer: BLUE CROSS/BLUE SHIELD | Admitting: Physician Assistant

## 2017-01-06 VITALS — BP 123/81 | HR 87 | Temp 98.7°F | Ht 67.0 in | Wt 233.6 lb

## 2017-01-06 DIAGNOSIS — E279 Disorder of adrenal gland, unspecified: Secondary | ICD-10-CM

## 2017-01-06 DIAGNOSIS — M5000 Cervical disc disorder with myelopathy, unspecified cervical region: Secondary | ICD-10-CM | POA: Diagnosis not present

## 2017-01-06 DIAGNOSIS — I10 Essential (primary) hypertension: Secondary | ICD-10-CM

## 2017-01-06 DIAGNOSIS — E278 Other specified disorders of adrenal gland: Secondary | ICD-10-CM

## 2017-01-06 MED ORDER — TIZANIDINE HCL 4 MG PO CAPS: 4.0000 mg | ORAL_CAPSULE | Freq: Three times a day (TID) | ORAL | 1 refills | Status: DC

## 2017-01-06 MED ORDER — HYDROCODONE-ACETAMINOPHEN 5-325 MG PO TABS: 1.0000 | ORAL_TABLET | Freq: Four times a day (QID) | ORAL | 0 refills | Status: DC | PRN

## 2017-01-06 MED ORDER — PREDNISONE 10 MG (21) PO TBPK: ORAL_TABLET | ORAL | 1 refills | Status: DC

## 2017-01-06 MED ORDER — METHYLPREDNISOLONE ACETATE 80 MG/ML IJ SUSP
80.0000 mg | Freq: Once | INTRAMUSCULAR | Status: AC
  Administered 2017-01-06: 80 mg via INTRAMUSCULAR

## 2017-01-06 MED ORDER — LISINOPRIL 5 MG PO TABS: 5.0000 mg | ORAL_TABLET | Freq: Every day | ORAL | 3 refills | Status: DC

## 2017-01-06 NOTE — Patient Instructions (Signed)
Cervical Radiculopathy  Cervical radiculopathy means that a nerve in the neck is pinched or bruised. This can cause pain or loss of feeling (numbness) that runs from your neck to your arm and fingers.  Follow these instructions at home:  Managing pain  ? Take over-the-counter and prescription medicines only as told by your doctor.  ? If directed, put ice on the injured or painful area.  ? Put ice in a plastic bag.  ? Place a towel between your skin and the bag.  ? Leave the ice on for 20 minutes, 2?3 times per day.  ? If ice does not help, you can try using heat. Take a warm shower or warm bath, or use a heat pack as told by your doctor.  ? You may try a gentle neck and shoulder massage.  Activity  ? Rest as needed. Follow instructions from your doctor about any activities to avoid.  ? Do exercises as told by your doctor or physical therapist.  General instructions  ? If you were given a soft collar, wear it as told by your doctor.  ? Use a flat pillow when you sleep.  ? Keep all follow-up visits as told by your doctor. This is important.  Contact a doctor if:  ? Your condition does not improve with treatment.  Get help right away if:  ? Your pain gets worse and is not controlled with medicine.  ? You lose feeling or feel weak in your hand, arm, face, or leg.  ? You have a fever.  ? You have a stiff neck.  ? You cannot control when you poop or pee (have incontinence).  ? You have trouble with walking, balance, or talking.  This information is not intended to replace advice given to you by your health care provider. Make sure you discuss any questions you have with your health care provider.  Document Released: 06/27/2011 Document Revised: 12/14/2015 Document Reviewed: 09/01/2014  Elsevier Interactive Patient Education ? 2018 [employer]

## 2017-01-06 NOTE — Progress Notes (Signed)
BP 123/81   Pulse 87   Temp 98.7 F (37.1 C) (Oral)   Ht 5\' 7"  (1.702 m)   Wt 233 lb 9.6 oz (106 kg)   BMI 36.59 kg/m    Subjective:    Patient ID: Jordan Harmon, female    DOB: 02/24/1961, 56 y.o.   MRN: [id number]  HPI: Jordan Harmon is a 56 y.o. female presenting on 01/06/2017 for Follow-up (6 month ); Numbness (right arm and hand); and Tingling (right arm and hand )  This patient comes in for periodic recheck on medications and conditions including 6 months or more increasing cervical pain on right, going down right arm. Reports increased weakness in the hand and does drop things.  Cannot work with the right arm very well.  Has tried ibuprofen, naproxen, prednisone and muscle relaxant without relief.  Pain is 10/10 almost all the time.   All medications are reviewed today. There are no reports of any problems with the medications. All of the medical conditions are reviewed and updated.  Lab work is reviewed and will be ordered as medically necessary. There are no new problems reported with today's visit.   Relevant past medical, surgical, family and social history reviewed and updated as indicated. Allergies and medications reviewed and updated.  Past Medical History:  Diagnosis Date  . Adrenal nodule ([place])   . Anemia   . Asthma    as child  . Depression   . Hypertension   . Obesity   . Pulmonary nodule   . Renal mass   . Restless leg syndrome     Past Surgical History:  Procedure Laterality Date  . ABDOMINAL HYSTERECTOMY    . NEPHRECTOMY     11/20/16  . ROBOT ASSISTED LAPAROSCOPIC NEPHRECTOMY Right 11/20/2016   Procedure: XI ROBOTIC ASSISTED LAPAROSCOPIC NEPHRECTOMY AND  TOTAL ADRENALECTOMY;  Surgeon: Alexis Frock, MD;  Location: [facility];  Service: Urology;  Laterality: Right;  . TUBAL LIGATION      Review of Systems  Constitutional: Negative for activity change, fatigue and fever.  HENT: Negative.   Eyes: Negative.   Respiratory: Negative.  Negative for  cough.   Cardiovascular: Negative.  Negative for chest pain.  Gastrointestinal: Negative.  Negative for abdominal pain.  Endocrine: Negative.   Genitourinary: Negative.  Negative for dysuria.  Musculoskeletal: Positive for arthralgias, back pain, neck pain and neck stiffness.  Skin: Negative.   Neurological: Positive for weakness.    Allergies as of 01/06/2017      Reactions   Penicillins Anaphylaxis   Has patient had a PCN reaction causing immediate rash, facial/tongue/throat swelling, SOB or lightheadedness with hypotension: Yes Has patient had a PCN reaction causing severe rash involving mucus membranes or skin necrosis: No Has patient had a PCN reaction that required hospitalization: Yes Has patient had a PCN reaction occurring within the last 10 years: No If all of the above answers are "NO", then may proceed with Cephalosporin use. Diona Fanti [aspirin] Hives   Latex Rash      Medication List       Accurate as of 01/06/17  8:44 PM. Always use your most recent med list.          HYDROcodone-acetaminophen 5-325 MG tablet Commonly known as:  NORCO Take 1 tablet by mouth every 6 (six) hours as needed for moderate pain.   lisinopril 5 MG tablet Commonly known as:  PRINIVIL,ZESTRIL Take 1 tablet (5 mg total) by mouth daily.  loratadine 10 MG tablet Commonly known as:  CLARITIN Take 10 mg by mouth daily as needed for allergies.   predniSONE 10 MG (21) Tbpk tablet Commonly known as:  STERAPRED UNI-PAK 21 TAB Take as directed   tiZANidine 4 MG capsule Commonly known as:  ZANAFLEX Take 1 capsule (4 mg total) by mouth 3 (three) times daily.          Objective:    BP 123/81   Pulse 87   Temp 98.7 F (37.1 C) (Oral)   Ht 5\' 7"  (1.702 m)   Wt 233 lb 9.6 oz (106 kg)   BMI 36.59 kg/m   Allergies  Allergen Reactions  . Penicillins Anaphylaxis    Has patient had a PCN reaction causing immediate rash, facial/tongue/throat swelling, SOB or lightheadedness with  hypotension: Yes Has patient had a PCN reaction causing severe rash involving mucus membranes or skin necrosis: No Has patient had a PCN reaction that required hospitalization: Yes Has patient had a PCN reaction occurring within the last 10 years: No If all of the above answers are "NO", then may proceed with Cephalosporin use. Marland Kitchen   Diona Fanti [Aspirin] Hives  . Latex Rash    Physical Exam  Constitutional: She is oriented to person, place, and time. She appears well-developed and well-nourished.  HENT:  Head: Normocephalic and atraumatic.  Eyes: Conjunctivae and EOM are normal. Pupils are equal, round, and reactive to light.  Cardiovascular: Normal rate, regular rhythm, normal heart sounds and intact distal pulses.   Pulmonary/Chest: Effort normal and breath sounds normal.  Abdominal: Soft. Bowel sounds are normal.  Musculoskeletal:       Cervical back: She exhibits decreased range of motion, tenderness, pain and spasm.       Back:  Neurological: She is alert and oriented to person, place, and time. She has normal reflexes. She displays no atrophy. She exhibits abnormal muscle tone.  Right shoulder weakness in adduction and grip  Skin: Skin is warm and dry. No rash noted.  Psychiatric: She has a normal mood and affect. Her behavior is normal. Judgment and thought content normal.  Nursing note and vitals reviewed.       Assessment & Plan:   1. Cervical disc disease with myelopathy - methylPREDNISolone acetate (DEPO-MEDROL) injection 80 mg; Inject 1 mL (80 mg total) into the muscle once. - predniSONE (STERAPRED UNI-PAK 21 TAB) 10 MG (21) TBPK tablet; Take as directed  Dispense: 21 tablet; Refill: 1 - tiZANidine (ZANAFLEX) 4 MG capsule; Take 1 capsule (4 mg total) by mouth 3 (three) times daily.  Dispense: 90 capsule; Refill: 1 - HYDROcodone-acetaminophen (NORCO) 5-325 MG tablet; Take 1 tablet by mouth every 6 (six) hours as needed for moderate pain.  Dispense: 90 tablet; Refill: 0 - MR  Lumbar Spine Wo Contrast; Future  2. Essential hypertension - lisinopril (PRINIVIL,ZESTRIL) 5 MG tablet; Take 1 tablet (5 mg total) by mouth daily.  Dispense: 90 tablet; Refill: 3  3. Adrenal mass ([place])   Current Outpatient Prescriptions:  .  loratadine (CLARITIN) 10 MG tablet, Take 10 mg by mouth daily as needed for allergies., Disp: , Rfl:  .  HYDROcodone-acetaminophen (NORCO) 5-325 MG tablet, Take 1 tablet by mouth every 6 (six) hours as needed for moderate pain., Disp: 90 tablet, Rfl: 0 .  lisinopril (PRINIVIL,ZESTRIL) 5 MG tablet, Take 1 tablet (5 mg total) by mouth daily., Disp: 90 tablet, Rfl: 3 .  predniSONE (STERAPRED UNI-PAK 21 TAB) 10 MG (21) TBPK tablet, Take as directed,  Disp: 21 tablet, Rfl: 1 .  tiZANidine (ZANAFLEX) 4 MG capsule, Take 1 capsule (4 mg total) by mouth 3 (three) times daily., Disp: 90 capsule, Rfl: 1  Continue all other maintenance medications as listed above.  Follow up plan: Return in about 4 weeks (around 02/03/2017) for recheck.  Educational handout given for cervical disc  Terald Sleeper PA-C [facility] [address]  [place], [place] [address] [phone]   01/06/2017, 8:44 PM

## 2017-01-10 ENCOUNTER — Telehealth: Payer: Self-pay

## 2017-01-10 NOTE — Telephone Encounter (Signed)
For prior authorization of Hydrocodone-Acetaminphen she is limited to 7 day supply

## 2017-01-10 NOTE — Telephone Encounter (Signed)
Can the pharmacy just fill a 7 day supply or do we have to do a new script?

## 2017-01-11 NOTE — Telephone Encounter (Signed)
Can pharmacy just fill a 7 day supply

## 2017-01-11 NOTE — Telephone Encounter (Signed)
Called [facility] to fill the 7 day supply while the PA was being worked on.

## 2017-01-14 ENCOUNTER — Emergency Department (HOSPITAL_COMMUNITY)
Admission: EM | Admit: 2017-01-14 | Discharge: 2017-01-14 | Disposition: A | Discharge status: Home / Self Care | Payer: BLUE CROSS/BLUE SHIELD | Attending: Emergency Medicine | Admitting: Emergency Medicine

## 2017-01-14 ENCOUNTER — Encounter (HOSPITAL_COMMUNITY): Payer: Self-pay

## 2017-01-14 DIAGNOSIS — J45909 Unspecified asthma, uncomplicated: Secondary | ICD-10-CM | POA: Diagnosis not present

## 2017-01-14 DIAGNOSIS — R630 Anorexia: Secondary | ICD-10-CM

## 2017-01-14 DIAGNOSIS — Z79899 Other long term (current) drug therapy: Secondary | ICD-10-CM | POA: Diagnosis not present

## 2017-01-14 DIAGNOSIS — F419 Anxiety disorder, unspecified: Secondary | ICD-10-CM | POA: Diagnosis present

## 2017-01-14 DIAGNOSIS — Z91040 Latex allergy status: Secondary | ICD-10-CM | POA: Insufficient documentation

## 2017-01-14 DIAGNOSIS — I10 Essential (primary) hypertension: Secondary | ICD-10-CM | POA: Insufficient documentation

## 2017-01-14 DIAGNOSIS — F4322 Adjustment disorder with anxiety: Secondary | ICD-10-CM | POA: Diagnosis not present

## 2017-01-14 DIAGNOSIS — F4321 Adjustment disorder with depressed mood: Secondary | ICD-10-CM

## 2017-01-14 NOTE — ED Notes (Signed)
See EDP secondary assessment.  

## 2017-01-14 NOTE — Discharge Instructions (Signed)
Follow-up with your primary care doctor as needed.   Make sure you are eating, drinking fluids and staying hydrated.   I have included some resources that you can use on an outpatient basis. Some of these resources include therapists that you can arrange to talk to if needed.   Return to the Emergency Department for any nausea/vomiting, abdominal pain, chest pain, difficulty breathing, feelings or thoughts of wanting to hurt yourself or others or any other concerning symptoms.    [place] in the [facility]  Intensive Outpatient Programs: [facility]      [place]. [place], [place] Both a day and evening program       [facility] Outpatient     [address]        [place], [place] [address] [phone]         ADS: Alcohol & Drug Sv[facility] [place]: [phone] or [phone] [address], [place] [address] [email or web address]   Substance Abuse Resources: Alcohol and Drug Services  [place] [phone] [facility] [place] [phone] Residential & Outpatient Substance Abuse Program  [phone]  Psychological Services: [place]  [phone] [facility]  [phone] [facility], [place] [address] [place], [place]: [phone] or [phone], [email or web address]  Mobile Crisis Teams:                                        [facility]         Mobile Crisis Care Unit [phone]             [facility] [address] Lady [place] [address] [place] [phone]  Self-Help/Support Groups: [place]. of [employer] of support groups 6234609237 (call for more info)  Narcotics Anonymous (NA) Caring Services [address] [place] - 2 meetings at this location  Residential Treatment Programs:  [place]       [place] [address] [place], [place]  [address] [place]  [address] [place], [place] [address] [phone] Admissions: 8am-3pm M-F  Incentives Substance [place]     [address] [place], [place] [address]       [phone]         [facility] [place] [address] Jadene Pierini [place], [place]  [facility] [address] [place], [place]  [facility] - Intensive Outpatient      [address]     [place], [place]         [facility] ([place]     [place], [place]  [phone] or [phone]  Residential Treatment Services (RTS), Medicaid [place], [place]  Fellowship [address]                                               [place]  [facility] Resources: [phone]               General Therapy                                                Domenic Schwab, PhD        [address] [place], [place] [address]         [facility]   [address] [place], [place] [address] [phone]  [facility] [address] [place], [place] [address] [phone] Insurance/Medicaid/sponsorship through [facility] and Families                                              [address] [place]                                         [place], [place] [address]    Therapy/tele-psych/case         [place] [address], [place]  [address]  Adolescent/group home/case management [phone]                                           Rosette Reveal PhD       General therapy       Insurance   [phone]         Dr. Adele Schilder, [place], M-F [phone]  [facility] Dept. [address]         [place] Phone:  [phone]  Phone:  [phone]                   Phone:  [place], [place] in [place], [address]             [phone], Alene Mires

## 2017-01-14 NOTE — ED Triage Notes (Signed)
Pt brought in by family to be evaluated. Pt is very upset and tearful as her daughter just passed away today. Pt tearful, denies any pain.

## 2017-01-14 NOTE — ED Provider Notes (Signed)
[facility] DEPT Provider Note   CSN: [id number] Arrival date & time: 01/14/17  1757     History   Chief Complaint Chief Complaint  Patient presents with  . Anxiety    HPI Jordan Harmon is a 56 y.o. female who presents with loss of appetite that began this morning. Patient's daughter unexpectedly died this morning and patient has been the entire day at [facility]. While here, patient has not felt like eating and has had some intermittent nausea and family brought her to the emergency department to be checked out to make sure there is no issues or concerns. Patient is not having any SI or HI. She denies any symptoms. She has been able to drink fluids but states that she doesn't feel like eating at this time because she is upset over the recent events. Patient denies any chest pain, difficulty breathing, abdominal pain, dysuria, hematuria.    The history is provided by the patient.    Past Medical History:  Diagnosis Date  . Adrenal nodule ([place])   . Anemia   . Asthma    as child  . Depression   . Hypertension   . Obesity   . Pulmonary nodule   . Renal mass   . Restless leg syndrome     Patient Active Problem List   Diagnosis Date Noted  . Cervical disc disease with myelopathy 01/06/2017  . Renal mass 11/20/2016  . Upper airway cough syndrome 09/03/2016  . Abnormal CT of the chest 09/03/2016  . Liver cyst 08/02/2016  . Intractable abdominal pain 07/27/2016  . Leukocytosis 07/27/2016  . Elevated lactic acid level 07/27/2016  . Abdominal pain 07/27/2016  . Nausea and vomiting 07/27/2016  . Restless leg syndrome   . Lung nodule seen on imaging study   . Right kidney mass   . Adrenal mass ([place])   . Morbid obesity due to excess calories ([place])   . Essential hypertension 05/23/2016  . Bunion of great toe of left foot 05/23/2016  . Numbness of left foot 05/23/2016    Past Surgical History:  Procedure Laterality Date  . ABDOMINAL HYSTERECTOMY    . NEPHRECTOMY      11/20/16  . ROBOT ASSISTED LAPAROSCOPIC NEPHRECTOMY Right 11/20/2016   Procedure: XI ROBOTIC ASSISTED LAPAROSCOPIC NEPHRECTOMY AND  TOTAL ADRENALECTOMY;  Surgeon: Alexis Frock, MD;  Location: [facility];  Service: Urology;  Laterality: Right;  . TUBAL LIGATION      OB History    No data available       Home Medications    Prior to Admission medications   Medication Sig Start Date End Date Taking? Authorizing Provider  HYDROcodone-acetaminophen (NORCO) 5-325 MG tablet Take 1 tablet by mouth every 6 (six) hours as needed for moderate pain. 01/06/17   Terald Sleeper, PA-C  lisinopril (PRINIVIL,ZESTRIL) 5 MG tablet Take 1 tablet (5 mg total) by mouth daily. 01/06/17   Terald Sleeper, PA-C  loratadine (CLARITIN) 10 MG tablet Take 10 mg by mouth daily as needed for allergies.    [provider]  predniSONE (STERAPRED UNI-PAK 21 TAB) 10 MG (21) TBPK tablet Take as directed 01/06/17   Terald Sleeper, PA-C  tiZANidine (ZANAFLEX) 4 MG capsule Take 1 capsule (4 mg total) by mouth 3 (three) times daily. 01/06/17   Terald Sleeper, PA-C    Family History Family History  Problem Relation Age of Onset  . Diabetes Mother   . Heart disease Mother     Social  History Social History  Substance Use Topics  . Smoking status: Never Smoker  . Smokeless tobacco: Never Used  . Alcohol use No     Allergies   Penicillins; Asa [aspirin]; and Latex   Review of Systems Review of Systems  Constitutional: Positive for appetite change.  Psychiatric/Behavioral: Positive for dysphoric mood. Negative for suicidal ideas.     Physical Exam Updated Vital Signs BP 131/69 (BP Location: Right Arm)   Pulse 82   Temp 98.4 F (36.9 C) (Oral)   Resp 18   SpO2 99%   Physical Exam  Constitutional: She appears well-developed and well-nourished.  Tearful throughout exam  HENT:  Head: Normocephalic and atraumatic.  Mouth/Throat: Uvula is midline, oropharynx is clear and moist and mucous membranes are  normal.  Eyes: Conjunctivae and EOM are normal. Right eye exhibits no discharge. Left eye exhibits no discharge. No scleral icterus.  Cardiovascular: Normal rate and regular rhythm.   Pulses:      Carotid pulses are 2+ on the right side, and 2+ on the left side. Pulmonary/Chest: Effort normal and breath sounds normal.  Neurological: She is alert.  Skin: Skin is warm and dry.  Psychiatric: She has a normal mood and affect. Her speech is normal and behavior is normal. She expresses no suicidal plans and no homicidal plans.  Nursing note and vitals reviewed.    ED Treatments / Results  Labs (all labs ordered are listed, but only abnormal results are displayed) Labs Reviewed - No data to display  EKG  EKG Interpretation None       Radiology No results found.  Procedures Procedures (including critical care time)  Medications Ordered in ED Medications - No data to display   Initial Impression / Assessment and Plan / ED Course  I have reviewed the triage vital signs and the nursing notes.  Pertinent labs & imaging results that were available during my care of the patient were reviewed by me and considered in my medical decision making (see chart for details).     56 year old female who presents with loss of appetite that began today after experiencing the death of her daughter. Family brought patient in for concerns and just wanted her to be checked out. Patient is afebrile, non-toxic appearing, appears tearful but in no acute distress. Vitals reviewed and stable. No clinical signs of dehydration. Patient denies having any other symptoms. Patient is tearful throughout the interview and states that she doesn't feel like eating at this time given everything that has been going on. Patient's presentation is consistent with grief given recent death of her daughter. Patient denies any SI/HI. She has been able to tolerate fluids well in the department. She has a good support system and  is staying with family tonight. No indications for labs at this time. Strict return precautions discussed patient and family expressed understanding and agreement plan  Final Clinical Impressions(s) / ED Diagnoses   Final diagnoses:  Grief  Loss of appetite    New Prescriptions Discharge Medication List as of 01/14/2017  7:16 PM       Volanda Napoleon, PA-C 01/15/17 9390    Davonna Belling, MD 01/15/17 1447

## 2017-01-28 ENCOUNTER — Other Ambulatory Visit: Payer: BLUE CROSS/BLUE SHIELD

## 2017-02-04 ENCOUNTER — Encounter: Payer: Self-pay | Admitting: Physician Assistant

## 2017-02-04 ENCOUNTER — Ambulatory Visit (INDEPENDENT_AMBULATORY_CARE_PROVIDER_SITE_OTHER): Payer: BLUE CROSS/BLUE SHIELD | Admitting: Physician Assistant

## 2017-02-04 VITALS — BP 115/79 | HR 80 | Temp 98.0°F | Ht 67.0 in | Wt 237.0 lb

## 2017-02-04 DIAGNOSIS — R252 Cramp and spasm: Secondary | ICD-10-CM | POA: Insufficient documentation

## 2017-02-04 DIAGNOSIS — M5000 Cervical disc disorder with myelopathy, unspecified cervical region: Secondary | ICD-10-CM | POA: Diagnosis not present

## 2017-02-04 DIAGNOSIS — I10 Essential (primary) hypertension: Secondary | ICD-10-CM | POA: Diagnosis not present

## 2017-02-04 DIAGNOSIS — D509 Iron deficiency anemia, unspecified: Secondary | ICD-10-CM | POA: Diagnosis not present

## 2017-02-04 DIAGNOSIS — F4321 Adjustment disorder with depressed mood: Secondary | ICD-10-CM | POA: Diagnosis not present

## 2017-02-04 DIAGNOSIS — Z634 Disappearance and death of family member: Secondary | ICD-10-CM

## 2017-02-04 MED ORDER — ALPRAZOLAM 0.5 MG PO TABS: 0.5000 mg | ORAL_TABLET | Freq: Two times a day (BID) | ORAL | 1 refills | Status: DC | PRN

## 2017-02-04 MED ORDER — HYDROCODONE-ACETAMINOPHEN 5-325 MG PO TABS: 1.0000 | ORAL_TABLET | Freq: Four times a day (QID) | ORAL | 0 refills | Status: DC | PRN

## 2017-02-04 MED ORDER — TIZANIDINE HCL 4 MG PO CAPS: 4.0000 mg | ORAL_CAPSULE | Freq: Three times a day (TID) | ORAL | 1 refills | Status: DC

## 2017-02-04 NOTE — Progress Notes (Signed)
BP 115/79   Pulse 80   Temp 98 F (36.7 C) (Oral)   Ht '5\' 7"'  (1.702 m)   Wt 237 lb (107.5 kg)   BMI 37.12 kg/m    Subjective:    Patient ID: Jordan Harmon, female    DOB: [birth year], 56 y.o.   MRN: [id number]  HPI: Jordan Harmon is a 56 y.o. female presenting on 02/04/2017 for Follow-up (4 week recheck. Patient was supposed to have had MRI and was unable to due to daughter passing away. Nephew passed away yesterday. ) and Cramps in legs (Started about 3 weeks ago)  This patient comes in for periodic recheck on medications and conditions including Hypertension, cervical disc disease. Overall she is doing well with these conditions. Her arm and neck still bother her at times. She has not gone back to work. Her work have let her go back when she had to have surgery. Unfortunately in the past couple weeks she has lost a daughter suddenly to an aneurysm. And one day ago she lost a nephew in his 54s to congestive heart failure. She is quite grief stricken at this time. She is having more leg cramps again. In the past she had had iron deficiency anemia. We will recheck this. She is can undergo ahead and start magnesium daily..   All medications are reviewed today. There are no reports of any problems with the medications. All of the medical conditions are reviewed and updated.  Lab work is reviewed and will be ordered as medically necessary. There are no new problems reported with today's visit.   Relevant past medical, surgical, family and social history reviewed and updated as indicated. Allergies and medications reviewed and updated.  Past Medical History:  Diagnosis Date  . Adrenal nodule ([place])   . Anemia   . Asthma    as child  . Depression   . Hypertension   . Obesity   . Pulmonary nodule   . Renal mass   . Restless leg syndrome     Past Surgical History:  Procedure Laterality Date  . ABDOMINAL HYSTERECTOMY    . NEPHRECTOMY     11/20/16  . ROBOT ASSISTED LAPAROSCOPIC  NEPHRECTOMY Right 11/20/2016   Procedure: XI ROBOTIC ASSISTED LAPAROSCOPIC NEPHRECTOMY AND  TOTAL ADRENALECTOMY;  Surgeon: Alexis Frock, MD;  [facility];  Service: Urology;  Laterality: Right;  . TUBAL LIGATION      Review of Systems  Constitutional: Positive for fatigue. Negative for activity change and fever.  HENT: Negative.   Eyes: Negative.   Respiratory: Negative.  Negative for cough.   Cardiovascular: Negative.  Negative for chest pain.  Gastrointestinal: Negative.  Negative for abdominal pain.  Endocrine: Negative.   Genitourinary: Negative.  Negative for dysuria.  Musculoskeletal: Positive for myalgias.  Skin: Negative.   Neurological: Negative.  Negative for dizziness.  Psychiatric/Behavioral: Positive for dysphoric mood. The patient is nervous/anxious.     Allergies as of 02/04/2017      Reactions   Penicillins Anaphylaxis   Has patient had a PCN reaction causing immediate rash, facial/tongue/throat swelling, SOB or lightheadedness with hypotension: Yes Has patient had a PCN reaction causing severe rash involving mucus membranes or skin necrosis: No Has patient had a PCN reaction that required hospitalization: Yes Has patient had a PCN reaction occurring within the last 10 years: No If all of the above answers are "NO", then may proceed with Cephalosporin use. Diona Fanti [aspirin] Hives   Latex Rash  Medication List       Accurate as of 02/04/17 10:51 AM. Always use your most recent med list.          ALPRAZolam 0.5 MG tablet Commonly known as:  XANAX Take 1 tablet (0.5 mg total) by mouth 2 (two) times daily as needed for anxiety.   HYDROcodone-acetaminophen 5-325 MG tablet Commonly known as:  NORCO Take 1 tablet by mouth every 6 (six) hours as needed for moderate pain.   lisinopril 5 MG tablet Commonly known as:  PRINIVIL,ZESTRIL Take 1 tablet (5 mg total) by mouth daily.   loratadine 10 MG tablet Commonly known as:  CLARITIN Take 10 mg by mouth  daily as needed for allergies.   tiZANidine 4 MG capsule Commonly known as:  ZANAFLEX Take 1 capsule (4 mg total) by mouth 3 (three) times daily.          Objective:    BP 115/79   Pulse 80   Temp 98 F (36.7 C) (Oral)   Ht '5\' 7"'  (1.702 m)   Wt 237 lb (107.5 kg)   BMI 37.12 kg/m   Allergies  Allergen Reactions  . Penicillins Anaphylaxis    Has patient had a PCN reaction causing immediate rash, facial/tongue/throat swelling, SOB or lightheadedness with hypotension: Yes Has patient had a PCN reaction causing severe rash involving mucus membranes or skin necrosis: No Has patient had a PCN reaction that required hospitalization: Yes Has patient had a PCN reaction occurring within the last 10 years: No If all of the above answers are "NO", then may proceed with Cephalosporin use. Marland Kitchen   Diona Fanti [Aspirin] Hives  . Latex Rash    Physical Exam  Constitutional: She is oriented to person, place, and time. She appears well-developed and well-nourished.  HENT:  Head: Normocephalic and atraumatic.  Right Ear: Tympanic membrane, external ear and ear canal normal.  Left Ear: Tympanic membrane, external ear and ear canal normal.  Nose: Nose normal. No rhinorrhea.  Mouth/Throat: Oropharynx is clear and moist and mucous membranes are normal. No oropharyngeal exudate or posterior oropharyngeal erythema.  Eyes: Pupils are equal, round, and reactive to light. Conjunctivae and EOM are normal.  Neck: Normal range of motion. Neck supple.  Cardiovascular: Normal rate, regular rhythm, normal heart sounds and intact distal pulses.   Pulmonary/Chest: Effort normal and breath sounds normal.  Abdominal: Soft. Bowel sounds are normal.  Musculoskeletal:       Cervical back: She exhibits decreased range of motion and tenderness.       Back:  Neurological: She is alert and oriented to person, place, and time. She has normal reflexes.  Skin: Skin is warm and dry. No rash noted.  Psychiatric: She has a  normal mood and affect. Her behavior is normal. Judgment and thought content normal.  Nursing note and vitals reviewed.   Results for orders placed or performed during the hospital encounter of 01/04/17  Protime-INR  Result Value Ref Range   Prothrombin Time 12.6 11.4 - 15.2 seconds   INR 0.94   APTT  Result Value Ref Range   aPTT 32 24 - 36 seconds  CBC  Result Value Ref Range   WBC 11.7 (H) 4.0 - 10.5 K/uL   RBC 4.77 3.87 - 5.11 MIL/uL   Hemoglobin 13.2 12.0 - 15.0 g/dL   HCT 40.3 36.0 - 46.0 %   MCV 84.5 78.0 - 100.0 fL   MCH 27.7 26.0 - 34.0 pg   MCHC 32.8 30.0 - 36.0  g/dL   RDW 14.0 11.5 - 15.5 %   Platelets 333 150 - 400 K/uL  Differential  Result Value Ref Range   Neutrophils Relative % 62 %   Neutro Abs 7.2 1.7 - 7.7 K/uL   Lymphocytes Relative 31 %   Lymphs Abs 3.6 0.7 - 4.0 K/uL   Monocytes Relative 5 %   Monocytes Absolute 0.6 0.1 - 1.0 K/uL   Eosinophils Relative 2 %   Eosinophils Absolute 0.3 0.0 - 0.7 K/uL   Basophils Relative 0 %   Basophils Absolute 0.0 0.0 - 0.1 K/uL  Comprehensive metabolic panel  Result Value Ref Range   Sodium 137 135 - 145 mmol/L   Potassium 3.8 3.5 - 5.1 mmol/L   Chloride 102 101 - 111 mmol/L   CO2 25 22 - 32 mmol/L   Glucose, Bld 130 (H) 65 - 99 mg/dL   BUN 24 (H) 6 - 20 mg/dL   Creatinine, Ser 1.51 (H) 0.44 - 1.00 mg/dL   Calcium 9.1 8.9 - 10.3 mg/dL   Total Protein 7.7 6.5 - 8.1 g/dL   Albumin 3.8 3.5 - 5.0 g/dL   AST 25 15 - 41 U/L   ALT 19 14 - 54 U/L   Alkaline Phosphatase 83 38 - 126 U/L   Total Bilirubin 0.3 0.3 - 1.2 mg/dL   GFR calc non Af Amer 38 (L) >60 mL/min   GFR calc Af Amer 44 (L) >60 mL/min   Anion gap 10 5 - 15  I-stat troponin, ED  Result Value Ref Range   Troponin i, poc 0.00 0.00 - 0.08 ng/mL   Comment 3          I-Stat Chem 8, ED  Result Value Ref Range   Sodium 140 135 - 145 mmol/L   Potassium 3.7 3.5 - 5.1 mmol/L   Chloride 102 101 - 111 mmol/L   BUN 27 (H) 6 - 20 mg/dL   Creatinine, Ser  1.60 (H) 0.44 - 1.00 mg/dL   Glucose, Bld 125 (H) 65 - 99 mg/dL   Calcium, Ion 1.11 (L) 1.15 - 1.40 mmol/L   TCO2 26 0 - 100 mmol/L   Hemoglobin 13.6 12.0 - 15.0 g/dL   HCT 40.0 36.0 - 46.0 %      Assessment & Plan:   1. Iron deficiency anemia, unspecified iron deficiency anemia type - CMP14+EGFR - CBC with Differential/Platelet  2. Leg cramps - CMP14+EGFR - CBC with Differential/Platelet  3. Essential hypertension  4. Cervical disc disease with myelopathy - tiZANidine (ZANAFLEX) 4 MG capsule; Take 1 capsule (4 mg total) by mouth 3 (three) times daily.  Dispense: 90 capsule; Refill: 1 - HYDROcodone-acetaminophen (NORCO) 5-325 MG tablet; Take 1 tablet by mouth every 6 (six) hours as needed for moderate pain.  Dispense: 90 tablet; Refill: 0  5. Grief at loss of child - ALPRAZolam (XANAX) 0.5 MG tablet; Take 1 tablet (0.5 mg total) by mouth 2 (two) times daily as needed for anxiety.  Dispense: 60 tablet; Refill: 1   Current Outpatient Prescriptions:  .  HYDROcodone-acetaminophen (NORCO) 5-325 MG tablet, Take 1 tablet by mouth every 6 (six) hours as needed for moderate pain., Disp: 90 tablet, Rfl: 0 .  lisinopril (PRINIVIL,ZESTRIL) 5 MG tablet, Take 1 tablet (5 mg total) by mouth daily., Disp: 90 tablet, Rfl: 3 .  loratadine (CLARITIN) 10 MG tablet, Take 10 mg by mouth daily as needed for allergies., Disp: , Rfl:  .  ALPRAZolam (XANAX) 0.5 MG tablet, Take  1 tablet (0.5 mg total) by mouth 2 (two) times daily as needed for anxiety., Disp: 60 tablet, Rfl: 1 .  tiZANidine (ZANAFLEX) 4 MG capsule, Take 1 capsule (4 mg total) by mouth 3 (three) times daily., Disp: 90 capsule, Rfl: 1  Continue all other maintenance medications as listed above.  Follow up plan: Return in about 3 months (around 05/07/2017) for recheck.  Educational handout given for [place] PA-C [place] [address]  [place], [place] [address] [phone]   02/04/2017,  10:51 AM

## 2017-02-04 NOTE — Patient Instructions (Signed)
In a few days you may receive a survey in the mail or online from [employer] regarding your visit with us today. Please take a moment to fill this out. Your feedback is very important to our whole office. It can help us better understand your needs as well as improve your experience and satisfaction. Thank you for taking your time to complete it. We care about you.  Mesa Janus, PA-C  

## 2017-02-05 ENCOUNTER — Telehealth: Payer: Self-pay | Admitting: Physician Assistant

## 2017-02-05 LAB — CBC WITH DIFFERENTIAL/PLATELET
BASOS: 0 %
Basophils Absolute: 0 10*3/uL (ref 0.0–0.2)
EOS (ABSOLUTE): 0.1 10*3/uL (ref 0.0–0.4)
Eos: 2 %
HEMATOCRIT: 38.6 % (ref 34.0–46.6)
Hemoglobin: 12.2 g/dL (ref 11.1–15.9)
Immature Grans (Abs): 0 10*3/uL (ref 0.0–0.1)
Immature Granulocytes: 0 %
Lymphocytes Absolute: 2.6 10*3/uL (ref 0.7–3.1)
Lymphs: 31 %
MCH: 27.2 pg (ref 26.6–33.0)
MCHC: 31.6 g/dL (ref 31.5–35.7)
MCV: 86 fL (ref 79–97)
MONOS ABS: 0.3 10*3/uL (ref 0.1–0.9)
Monocytes: 4 %
NEUTROS ABS: 5.3 10*3/uL (ref 1.4–7.0)
Neutrophils: 63 %
Platelets: 281 10*3/uL (ref 150–379)
RBC: 4.49 x10E6/uL (ref 3.77–5.28)
RDW: 15.3 % (ref 12.3–15.4)
WBC: 8.3 10*3/uL (ref 3.4–10.8)

## 2017-02-05 LAB — CMP14+EGFR
A/G RATIO: 1.3 (ref 1.2–2.2)
ALBUMIN: 4.1 g/dL (ref 3.5–5.5)
ALT: 21 IU/L (ref 0–32)
AST: 22 IU/L (ref 0–40)
Alkaline Phosphatase: 79 IU/L (ref 39–117)
BUN / CREAT RATIO: 18 (ref 9–23)
BUN: 23 mg/dL (ref 6–24)
Bilirubin Total: 0.3 mg/dL (ref 0.0–1.2)
CO2: 23 mmol/L (ref 20–29)
Calcium: 9.1 mg/dL (ref 8.7–10.2)
Chloride: 103 mmol/L (ref 96–106)
Creatinine, Ser: 1.25 mg/dL — ABNORMAL HIGH (ref 0.57–1.00)
GFR, EST AFRICAN AMERICAN: 56 mL/min/{1.73_m2} — AB (ref >59)
GFR, EST NON AFRICAN AMERICAN: 48 mL/min/{1.73_m2} — AB (ref >59)
GLOBULIN, TOTAL: 3.1 g/dL (ref 1.5–4.5)
Glucose: 83 mg/dL (ref 65–99)
POTASSIUM: 4.9 mmol/L (ref 3.5–5.2)
SODIUM: 141 mmol/L (ref 134–144)
TOTAL PROTEIN: 7.2 g/dL (ref 6.0–8.5)

## 2017-03-02 ENCOUNTER — Other Ambulatory Visit: Payer: Self-pay | Admitting: Physician Assistant

## 2017-03-02 DIAGNOSIS — M5000 Cervical disc disorder with myelopathy, unspecified cervical region: Secondary | ICD-10-CM

## 2017-05-07 ENCOUNTER — Ambulatory Visit: Payer: BLUE CROSS/BLUE SHIELD | Admitting: Physician Assistant

## 2017-07-18 ENCOUNTER — Other Ambulatory Visit: Payer: Self-pay | Admitting: Physician Assistant

## 2017-07-18 DIAGNOSIS — I10 Essential (primary) hypertension: Secondary | ICD-10-CM

## 2017-11-13 ENCOUNTER — Ambulatory Visit: Payer: BLUE CROSS/BLUE SHIELD | Admitting: Physician Assistant

## 2017-11-13 ENCOUNTER — Encounter: Payer: Self-pay | Admitting: Physician Assistant

## 2017-11-13 VITALS — BP 125/77 | HR 81 | Temp 98.4°F | Ht 67.0 in | Wt 223.0 lb

## 2017-11-13 DIAGNOSIS — M25511 Pain in right shoulder: Secondary | ICD-10-CM

## 2017-11-13 DIAGNOSIS — G8929 Other chronic pain: Secondary | ICD-10-CM

## 2017-11-13 MED ORDER — MELOXICAM 7.5 MG PO TABS: 7.5000 mg | ORAL_TABLET | Freq: Every day | ORAL | 5 refills | Status: DC

## 2017-11-13 MED ORDER — CYCLOBENZAPRINE HCL 10 MG PO TABS: 10.0000 mg | ORAL_TABLET | Freq: Three times a day (TID) | ORAL | 5 refills | Status: DC | PRN

## 2017-11-13 MED ORDER — METHYLPREDNISOLONE ACETATE 80 MG/ML IJ SUSP
80.0000 mg | Freq: Once | INTRAMUSCULAR | Status: AC
Start: 2017-11-13 — End: 2017-11-13
  Administered 2017-11-13: 80 mg via INTRAMUSCULAR

## 2017-11-13 NOTE — Progress Notes (Signed)
BP 125/77   Pulse 81   Temp 98.4 F (36.9 C) (Oral)   Ht 5\' 7"  (1.702 m)   Wt 223 lb (101.2 kg)   BMI 34.93 kg/m    Subjective:    Patient ID: Jordan Harmon, female    DOB: October 12, 1960, 57 y.o.   MRN: [id number]  HPI: Jordan Harmon is a 57 y.o. female presenting on 11/13/2017 for Neck Pain (radiates to right shoulder for 1 month )  Patient comes in with chronic right shoulder pain.  He has been going on for longer than a month.  She does do work overhead.  She for the time was homeless and having to sleep in her car and she knows it got flared up then severely.  She is staying at a friend's house now and anticipates being able to move back into her house but have been uses her rental property.  She will see her nephrologist next month and labs will be performed there. Past Medical History:  Diagnosis Date  . Adrenal nodule ([place])   . Anemia   . Asthma    as child  . Depression   . Hypertension   . Obesity   . Pulmonary nodule   . Renal mass   . Restless leg syndrome    Relevant past medical, surgical, family and social history reviewed and updated as indicated. Interim medical history since our last visit reviewed. Allergies and medications reviewed and updated. DATA REVIEWED: CHART IN EPIC  Family History reviewed for pertinent findings.  Review of Systems  Constitutional: Negative.   HENT: Negative.   Eyes: Negative.   Respiratory: Negative.   Gastrointestinal: Negative.   Genitourinary: Negative.   Musculoskeletal: Positive for arthralgias, back pain, joint swelling and myalgias.    Allergies as of 11/13/2017      Reactions   Penicillins Anaphylaxis   Has patient had a PCN reaction causing immediate rash, facial/tongue/throat swelling, SOB or lightheadedness with hypotension: Yes Has patient had a PCN reaction causing severe rash involving mucus membranes or skin necrosis: No Has patient had a PCN reaction that required hospitalization: Yes Has patient  had a PCN reaction occurring within the last 10 years: No If all of the above answers are "NO", then may proceed with Cephalosporin use. Diona Fanti [aspirin] Hives   Latex Rash      Medication List        Accurate as of 11/13/17 10:01 AM. Always use your most recent med list.          cyclobenzaprine 10 MG tablet Commonly known as:  FLEXERIL Take 1 tablet (10 mg total) by mouth 3 (three) times daily as needed for muscle spasms.   meloxicam 7.5 MG tablet Commonly known as:  MOBIC Take 1 tablet (7.5 mg total) by mouth daily.          Objective:    BP 125/77   Pulse 81   Temp 98.4 F (36.9 C) (Oral)   Ht 5\' 7"  (1.702 m)   Wt 223 lb (101.2 kg)   BMI 34.93 kg/m   Allergies  Allergen Reactions  . Penicillins Anaphylaxis    Has patient had a PCN reaction causing immediate rash, facial/tongue/throat swelling, SOB or lightheadedness with hypotension: Yes Has patient had a PCN reaction causing severe rash involving mucus membranes or skin necrosis: No Has patient had a PCN reaction that required hospitalization: Yes Has patient had a PCN reaction occurring within the last 10 years:  No If all of the above answers are "NO", then may proceed with Cephalosporin use. Marland Kitchen   Diona Fanti [Aspirin] Hives  . Latex Rash    Wt Readings from Last 3 Encounters:  11/13/17 223 lb (101.2 kg)  02/04/17 237 lb (107.5 kg)  01/06/17 233 lb 9.6 oz (106 kg)    Physical Exam  Constitutional: She is oriented to person, place, and time. She appears well-developed and well-nourished.  HENT:  Head: Normocephalic and atraumatic.  Eyes: Pupils are equal, round, and reactive to light. Conjunctivae and EOM are normal.  Cardiovascular: Normal rate, regular rhythm, normal heart sounds and intact distal pulses.  Pulmonary/Chest: Effort normal and breath sounds normal.  Abdominal: Soft. Bowel sounds are normal.  Musculoskeletal:       Cervical back: She exhibits decreased range of motion, pain and spasm.        Back:  Neurological: She is alert and oriented to person, place, and time. She has normal reflexes.  Skin: Skin is warm and dry. No rash noted.  Psychiatric: She has a normal mood and affect. Her behavior is normal. Judgment and thought content normal.        Assessment & Plan:   1. Chronic right shoulder pain - cyclobenzaprine (FLEXERIL) 10 MG tablet; Take 1 tablet (10 mg total) by mouth 3 (three) times daily as needed for muscle spasms.  Dispense: 30 tablet; Refill: 5 - meloxicam (MOBIC) 7.5 MG tablet; Take 1 tablet (7.5 mg total) by mouth daily.  Dispense: 30 tablet; Refill: 5 - methylPREDNISolone acetate (DEPO-MEDROL) injection 80 mg'   Continue all other maintenance medications as listed above.  Follow up plan: No follow-ups on file.  Educational handout given for Van Tassell PA-C [facility] [address]  [place], [place] [address] [phone]   11/13/2017, 10:01 AM

## 2017-12-17 ENCOUNTER — Other Ambulatory Visit: Payer: Self-pay | Admitting: Urology

## 2017-12-17 ENCOUNTER — Ambulatory Visit (HOSPITAL_COMMUNITY)
Admission: RE | Admit: 2017-12-17 | Discharge: 2017-12-17 | Disposition: A | Discharge status: Home / Self Care | Payer: Self-pay | Source: Ambulatory Visit | Attending: Urology | Admitting: Urology

## 2017-12-17 DIAGNOSIS — C641 Malignant neoplasm of right kidney, except renal pelvis: Secondary | ICD-10-CM

## 2017-12-17 DIAGNOSIS — R918 Other nonspecific abnormal finding of lung field: Secondary | ICD-10-CM | POA: Insufficient documentation

## 2017-12-31 ENCOUNTER — Encounter: Payer: Self-pay | Admitting: Family Medicine

## 2017-12-31 ENCOUNTER — Ambulatory Visit (INDEPENDENT_AMBULATORY_CARE_PROVIDER_SITE_OTHER): Payer: BLUE CROSS/BLUE SHIELD | Admitting: Family Medicine

## 2017-12-31 VITALS — BP 132/83 | HR 80 | Temp 97.9°F | Ht 67.0 in | Wt 234.4 lb

## 2017-12-31 DIAGNOSIS — I10 Essential (primary) hypertension: Secondary | ICD-10-CM | POA: Diagnosis not present

## 2017-12-31 MED ORDER — AMLODIPINE BESYLATE 2.5 MG PO TABS: 2.5000 mg | ORAL_TABLET | Freq: Every day | ORAL | 1 refills | Status: DC

## 2017-12-31 NOTE — Progress Notes (Signed)
   HPI  Patient presents today with elevated blood pressure.  Patient states she was working and felt very lightheaded and woozy.  Her blood pressure was checked and found to be over 834 systolic.  She states it was 209/125 and 193/120, then after rest 193/115.  She denies any chest pain. She has had some right arm and shoulder numbness and tingling has been going on for about a month, she feels this is due to neck or shoulder pain.  Patient was previously on blood pressure medications and they were stopped due to having low blood pressure.  Patient has had some slight lightheadedness when first standing up as well.  PMH: Smoking status noted ROS: Per HPI  Objective: BP 132/83   Pulse 80   Temp 97.9 F (36.6 C) (Oral)   Ht 5\' 7"  (1.702 m)   Wt 234 lb 6.4 oz (106.3 kg)   BMI 36.71 kg/m  Gen: NAD, alert, cooperative with exam HEENT: NCAT CV: RRR, good S1/S2, no murmur Resp: CTABL, no wheezes, non-labored Ext: No edema, warm Neuro: Alert and oriented, No gross deficits  Assessment and plan:  #Hypertension Patient with previous history of hypertension, now with elevated blood pressure at work.  Patient states at home her blood pressure has been in the 160s and 70s at times. She has had the same but less severe feeling off and on over the last few weeks. 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    Meds ordered this encounter  Medications  . amLODipine (NORVASC) 2.5 MG tablet    Sig: Take 1 tablet (2.5 mg total) by mouth daily.    Dispense:  30 tablet    Refill:  [place], MD [facility] 12/31/2017, 3:46 PM

## 2017-12-31 NOTE — Patient Instructions (Signed)
Great to meet you!  Start Amlodipine  1 pill once daily, come back in 2-3 weeks to see [facility].

## 2018-01-13 ENCOUNTER — Encounter: Payer: Self-pay | Admitting: *Deleted

## 2018-02-22 ENCOUNTER — Other Ambulatory Visit: Payer: Self-pay | Admitting: Family Medicine

## 2018-03-03 ENCOUNTER — Ambulatory Visit: Payer: Self-pay | Admitting: Physician Assistant

## 2018-03-10 ENCOUNTER — Ambulatory Visit: Payer: Self-pay | Admitting: Physician Assistant

## 2018-05-22 ENCOUNTER — Ambulatory Visit (INDEPENDENT_AMBULATORY_CARE_PROVIDER_SITE_OTHER): Payer: Self-pay | Admitting: Physician Assistant

## 2018-05-22 ENCOUNTER — Encounter: Payer: Self-pay | Admitting: Physician Assistant

## 2018-05-22 VITALS — BP 139/81 | HR 106 | Temp 98.1°F | Ht 67.0 in | Wt 247.0 lb

## 2018-05-22 DIAGNOSIS — M542 Cervicalgia: Secondary | ICD-10-CM

## 2018-05-22 DIAGNOSIS — S0083XD Contusion of other part of head, subsequent encounter: Secondary | ICD-10-CM

## 2018-05-22 DIAGNOSIS — M25552 Pain in left hip: Secondary | ICD-10-CM

## 2018-05-22 DIAGNOSIS — M25562 Pain in left knee: Secondary | ICD-10-CM

## 2018-05-22 MED ORDER — PREDNISONE 10 MG (21) PO TBPK: ORAL_TABLET | ORAL | 0 refills | Status: DC

## 2018-05-22 MED ORDER — OXYCODONE-ACETAMINOPHEN 10-325 MG PO TABS: 1.0000 | ORAL_TABLET | ORAL | 0 refills | Status: DC | PRN
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
## 2018-05-29 ENCOUNTER — Other Ambulatory Visit: Payer: Self-pay | Admitting: Physician Assistant

## 2018-06-11 IMAGING — CR DG CHEST 1V PORT
1 series · 1 of 1 positions shown · non-contrast
Comparison: 01/11/2015

CLINICAL DATA: Dyspnea this morning.  Elevated lactate.

EXAM:
PORTABLE CHEST 1 VIEW

[AP]
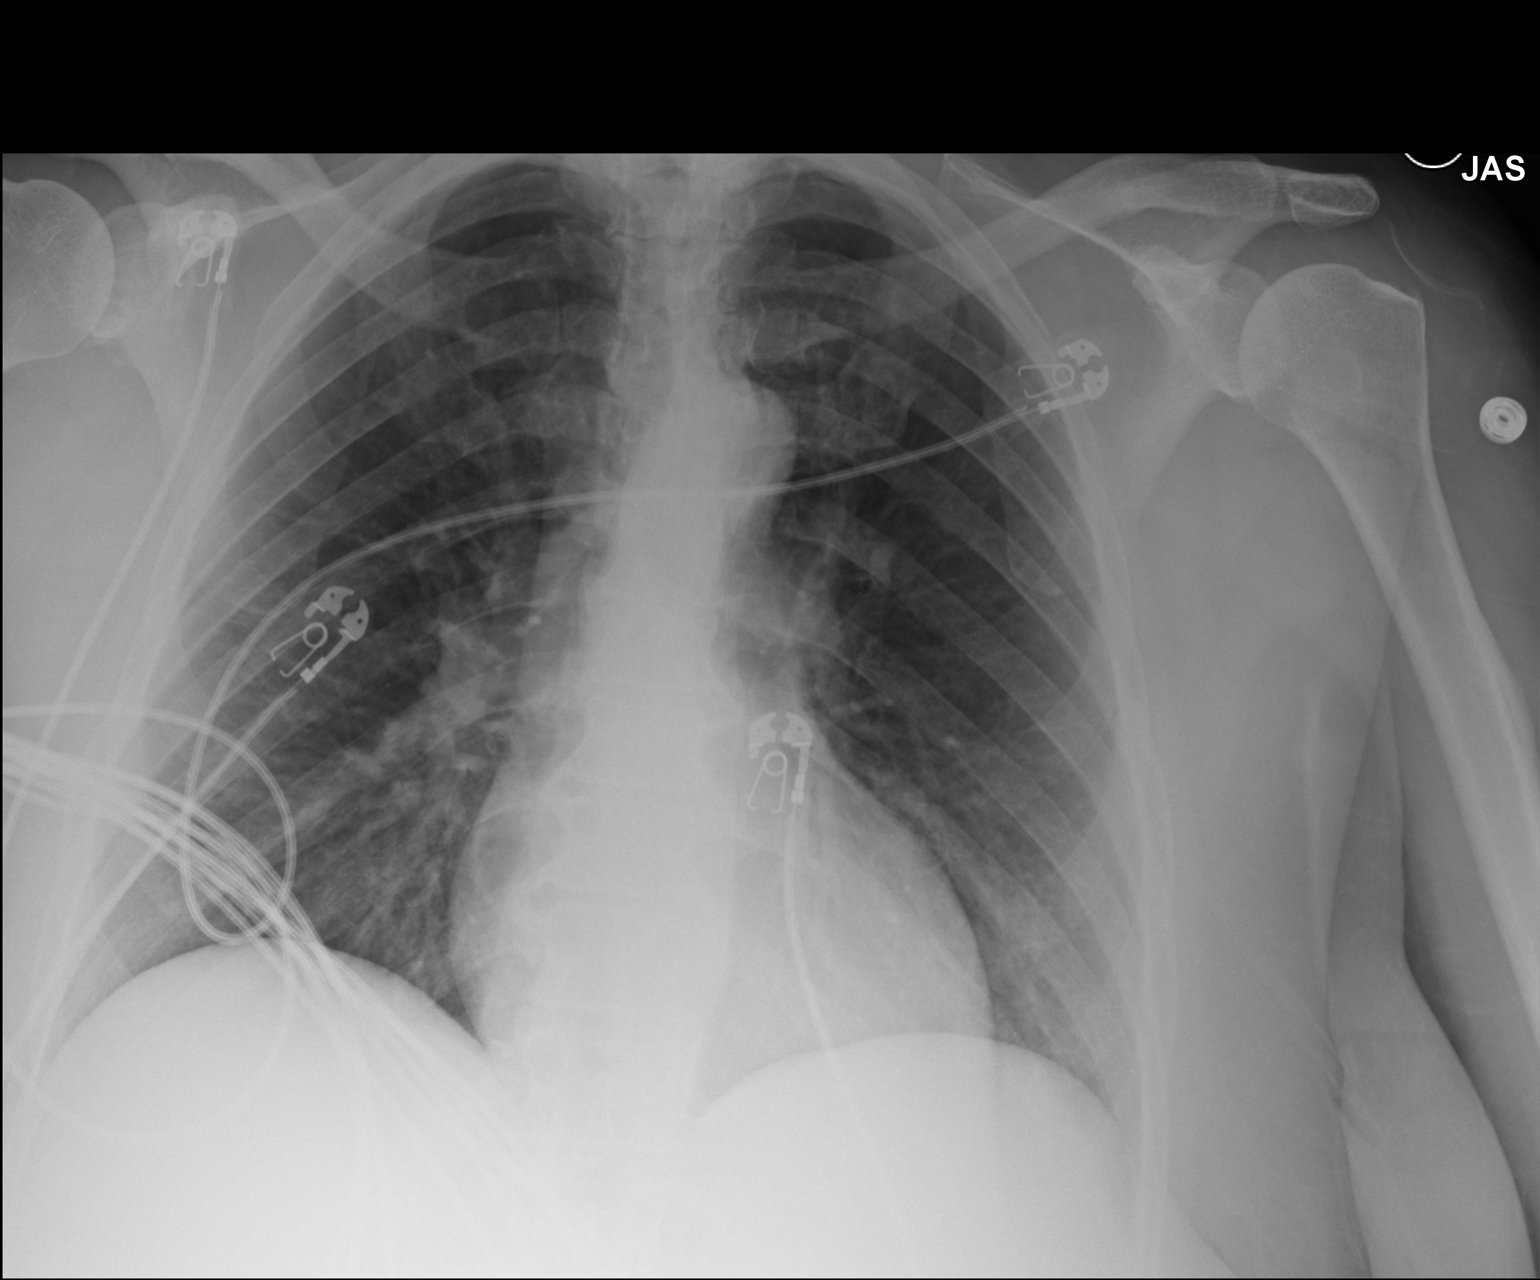

[1 of 1 positions shown; findings below may reference images not displayed]

FINDINGS: A single AP portable view of the chest demonstrates no focal
airspace consolidation or alveolar edema. The lungs are grossly
clear. There is no large effusion or pneumothorax. Cardiac and
mediastinal contours appear unremarkable.
IMPRESSION: No active disease.

## 2018-10-23 ENCOUNTER — Other Ambulatory Visit: Payer: Self-pay | Admitting: Physician Assistant

## 2019-01-11 ENCOUNTER — Telehealth: Payer: Self-pay | Admitting: Physician Assistant

## 2019-04-24 ENCOUNTER — Other Ambulatory Visit: Payer: Self-pay | Admitting: Physician Assistant

## 2019-04-26 ENCOUNTER — Telehealth: Payer: Self-pay | Admitting: Physician Assistant

## 2019-04-26 NOTE — Telephone Encounter (Signed)
Left message to please call our officeto schedule appointment. Last visit November 2019 and last labs 2018.

## 2019-04-26 NOTE — Telephone Encounter (Signed)
Jones. NTBS Last OV 12/31/17 for HTN Nov OV acute

## 2019-04-27 ENCOUNTER — Other Ambulatory Visit: Payer: Self-pay | Admitting: Physician Assistant

## 2019-04-27 ENCOUNTER — Other Ambulatory Visit: Payer: Self-pay

## 2019-04-27 ENCOUNTER — Other Ambulatory Visit: Payer: 59

## 2019-04-27 DIAGNOSIS — Z0000 Encounter for general adult medical examination without abnormal findings: Secondary | ICD-10-CM

## 2019-04-27 NOTE — Telephone Encounter (Signed)
Attempted to contact patient - NVM 

## 2019-04-28 ENCOUNTER — Ambulatory Visit (INDEPENDENT_AMBULATORY_CARE_PROVIDER_SITE_OTHER): Payer: 59 | Admitting: Physician Assistant

## 2019-04-28 ENCOUNTER — Encounter: Payer: Self-pay | Admitting: Physician Assistant

## 2019-04-28 DIAGNOSIS — I10 Essential (primary) hypertension: Secondary | ICD-10-CM | POA: Diagnosis not present

## 2019-04-28 LAB — CBC WITH DIFFERENTIAL/PLATELET
Basophils Absolute: 0.1 10*3/uL (ref 0.0–0.2)
Basos: 1 %
EOS (ABSOLUTE): 0.2 10*3/uL (ref 0.0–0.4)
Eos: 2 %
Hematocrit: 36.1 % (ref 34.0–46.6)
Hemoglobin: 12.2 g/dL (ref 11.1–15.9)
Immature Grans (Abs): 0 10*3/uL (ref 0.0–0.1)
Immature Granulocytes: 0 %
Lymphocytes Absolute: 2.9 10*3/uL (ref 0.7–3.1)
Lymphs: 27 %
MCH: 27.7 pg (ref 26.6–33.0)
MCHC: 33.8 g/dL (ref 31.5–35.7)
MCV: 82 fL (ref 79–97)
Monocytes Absolute: 0.7 10*3/uL (ref 0.1–0.9)
Monocytes: 6 %
Neutrophils Absolute: 6.9 10*3/uL (ref 1.4–7.0)
Neutrophils: 64 %
Platelets: 349 10*3/uL (ref 150–450)
RBC: 4.41 x10E6/uL (ref 3.77–5.28)
RDW: 13.9 % (ref 11.7–15.4)
WBC: 10.7 10*3/uL (ref 3.4–10.8)

## 2019-04-28 LAB — CMP14+EGFR
ALT: 21 IU/L (ref 0–32)
AST: 27 IU/L (ref 0–40)
Albumin/Globulin Ratio: 1.3 (ref 1.2–2.2)
Albumin: 4.2 g/dL (ref 3.8–4.9)
Alkaline Phosphatase: 99 IU/L (ref 39–117)
BUN/Creatinine Ratio: 13 (ref 9–23)
BUN: 16 mg/dL (ref 6–24)
Bilirubin Total: <0.2 mg/dL (ref 0.0–1.2)
CO2: 24 mmol/L (ref 20–29)
Calcium: 9.5 mg/dL (ref 8.7–10.2)
Chloride: 106 mmol/L (ref 96–106)
Creatinine, Ser: 1.21 mg/dL — ABNORMAL HIGH (ref 0.57–1.00)
GFR calc Af Amer: 57 mL/min/{1.73_m2} — ABNORMAL LOW (ref >59)
GFR calc non Af Amer: 49 mL/min/{1.73_m2} — ABNORMAL LOW (ref >59)
Globulin, Total: 3.2 g/dL (ref 1.5–4.5)
Glucose: 104 mg/dL — ABNORMAL HIGH (ref 65–99)
Potassium: 3.8 mmol/L (ref 3.5–5.2)
Sodium: 143 mmol/L (ref 134–144)
Total Protein: 7.4 g/dL (ref 6.0–8.5)

## 2019-04-28 LAB — LIPID PANEL
Chol/HDL Ratio: 4 ratio (ref 0.0–4.4)
Cholesterol, Total: 190 mg/dL (ref 100–199)
HDL: 47 mg/dL (ref >39)
LDL Chol Calc (NIH): 119 mg/dL — ABNORMAL HIGH (ref 0–99)
Triglycerides: 134 mg/dL (ref 0–149)
VLDL Cholesterol Cal: 24 mg/dL (ref 5–40)

## 2019-04-28 LAB — TSH: TSH: 1.24 u[IU]/mL (ref 0.450–4.500)

## 2019-04-28 MED ORDER — AMLODIPINE BESYLATE 2.5 MG PO TABS: 2.5000 mg | ORAL_TABLET | Freq: Every day | ORAL | 3 refills | Status: DC

## 2019-04-28 NOTE — Progress Notes (Signed)
311     Telephone visit  Subjective: [id number] chronic conditions PCP: Terald Sleeper, PA-C [id number] Jordan Harmon is a 58 y.o. female calls for telephone consult today. Patient provides verbal consent for consult held via phone.  Patient is identified with 2 separate identifiers.  At this time the entire area is on COVID-19 social distancing and stay home orders are in place.  Patient is of higher risk and therefore we are performing this by a virtual method.  Location of patient: home Location of provider: HOME Others present for call: no   BP check  [employer] employee so she gets vitals taken regularly and of course her temperature check regularly.  Blood pressure normally runs around 120 or under and 80 or under.  So 120/80 is the average.  She does not have any complaints of chest pain, shortness of breath, swelling in her feet.  She has tolerated her medication well.  She does have a history of only having one kidney.  Labs were checked and her kidney functions are stable.  We have reiterated that she should avoid any NSAIDs.  And that she should always hydrate very well.  We can plan to recheck her in about 6 months so that we can see her in the office.  We have discussed all of her labs with her and they look very good and can be rechecked in 1 year.   ROS: Per HPI  Allergies  Allergen Reactions  . Penicillins Anaphylaxis    Has patient had a PCN reaction causing immediate rash, facial/tongue/throat swelling, SOB or lightheadedness with hypotension: Yes Has patient had a PCN reaction causing severe rash involving mucus membranes or skin necrosis: No Has patient had a PCN reaction that required hospitalization: Yes Has patient had a PCN reaction occurring within the last 10 years: No If all of the above answers are "NO", then may proceed with Cephalosporin use. Marland Kitchen   Diona Fanti [Aspirin] Hives  . Latex Rash   Past Medical History:  Diagnosis Date  . Adrenal nodule ([place])    . Anemia   . Asthma    as child  . Depression   . Hypertension   . Obesity   . Pulmonary nodule   . Renal mass   . Restless leg syndrome     Current Outpatient Medications:  .  amLODipine (NORVASC) 2.5 MG tablet, TAKE 1 TABLET BY MOUTH EVERY DAY, Disp: 90 tablet, Rfl: 1 .  cyclobenzaprine (FLEXERIL) 10 MG tablet, Take 1 tablet (10 mg total) by mouth 3 (three) times daily as needed for muscle spasms., Disp: 30 tablet, Rfl: 5 .  oxyCODONE-acetaminophen (PERCOCET) 10-325 MG tablet, Take 1 tablet by mouth every 4 (four) hours as needed for pain., Disp: 30 tablet, Rfl: 0 .  predniSONE (STERAPRED UNI-PAK 21 TAB) 10 MG (21) TBPK tablet, As directed x 6 days, Disp: 21 tablet, Rfl: 0  Assessment/ Plan: 58 y.o. female   1. Essential hypertension - amLODipine (NORVASC) 2.5 MG tablet; Take 1 tablet (2.5 mg total) by mouth daily.  Dispense: 90 tablet; Refill: 3   No follow-ups on file.  Continue all other maintenance medications as listed above.  Start time: 3:09 PM End time: 3:19 PM  No orders of the defined types were placed in this encounter.   Particia Nearing PA-C [facility] [phone]

## 2019-09-02 ENCOUNTER — Telehealth: Payer: Self-pay | Admitting: Physician Assistant

## 2019-09-02 NOTE — Telephone Encounter (Signed)
Per CDC guidelines this is not a contraindication and she may receive the vaccine.

## 2019-09-02 NOTE — Telephone Encounter (Signed)
Advise ,please. 

## 2019-09-02 NOTE — Telephone Encounter (Signed)
Jones patient, you are her back up today.

## 2019-09-02 NOTE — Telephone Encounter (Signed)
Left message can get covid vaccine.   Please call our office for further concerns or questions.

## 2019-09-29 ENCOUNTER — Ambulatory Visit: Payer: 59 | Admitting: Physician Assistant

## 2019-10-14 ENCOUNTER — Other Ambulatory Visit: Payer: Self-pay | Admitting: Physician Assistant

## 2019-10-14 DIAGNOSIS — I10 Essential (primary) hypertension: Secondary | ICD-10-CM

## 2019-10-26 ENCOUNTER — Ambulatory Visit: Payer: 59 | Admitting: Physician Assistant

## 2019-11-01 IMAGING — CR DG CHEST 2V
2 series · 2 of 2 positions shown · non-contrast
Comparison: Chest x-ray and chest CT scan July 27, 2016

CLINICAL DATA: History of renal cell malignancy. Surveillance
follow-up. No chest complaints.

EXAM:
CHEST - 2 VIEW

[w chest pa]
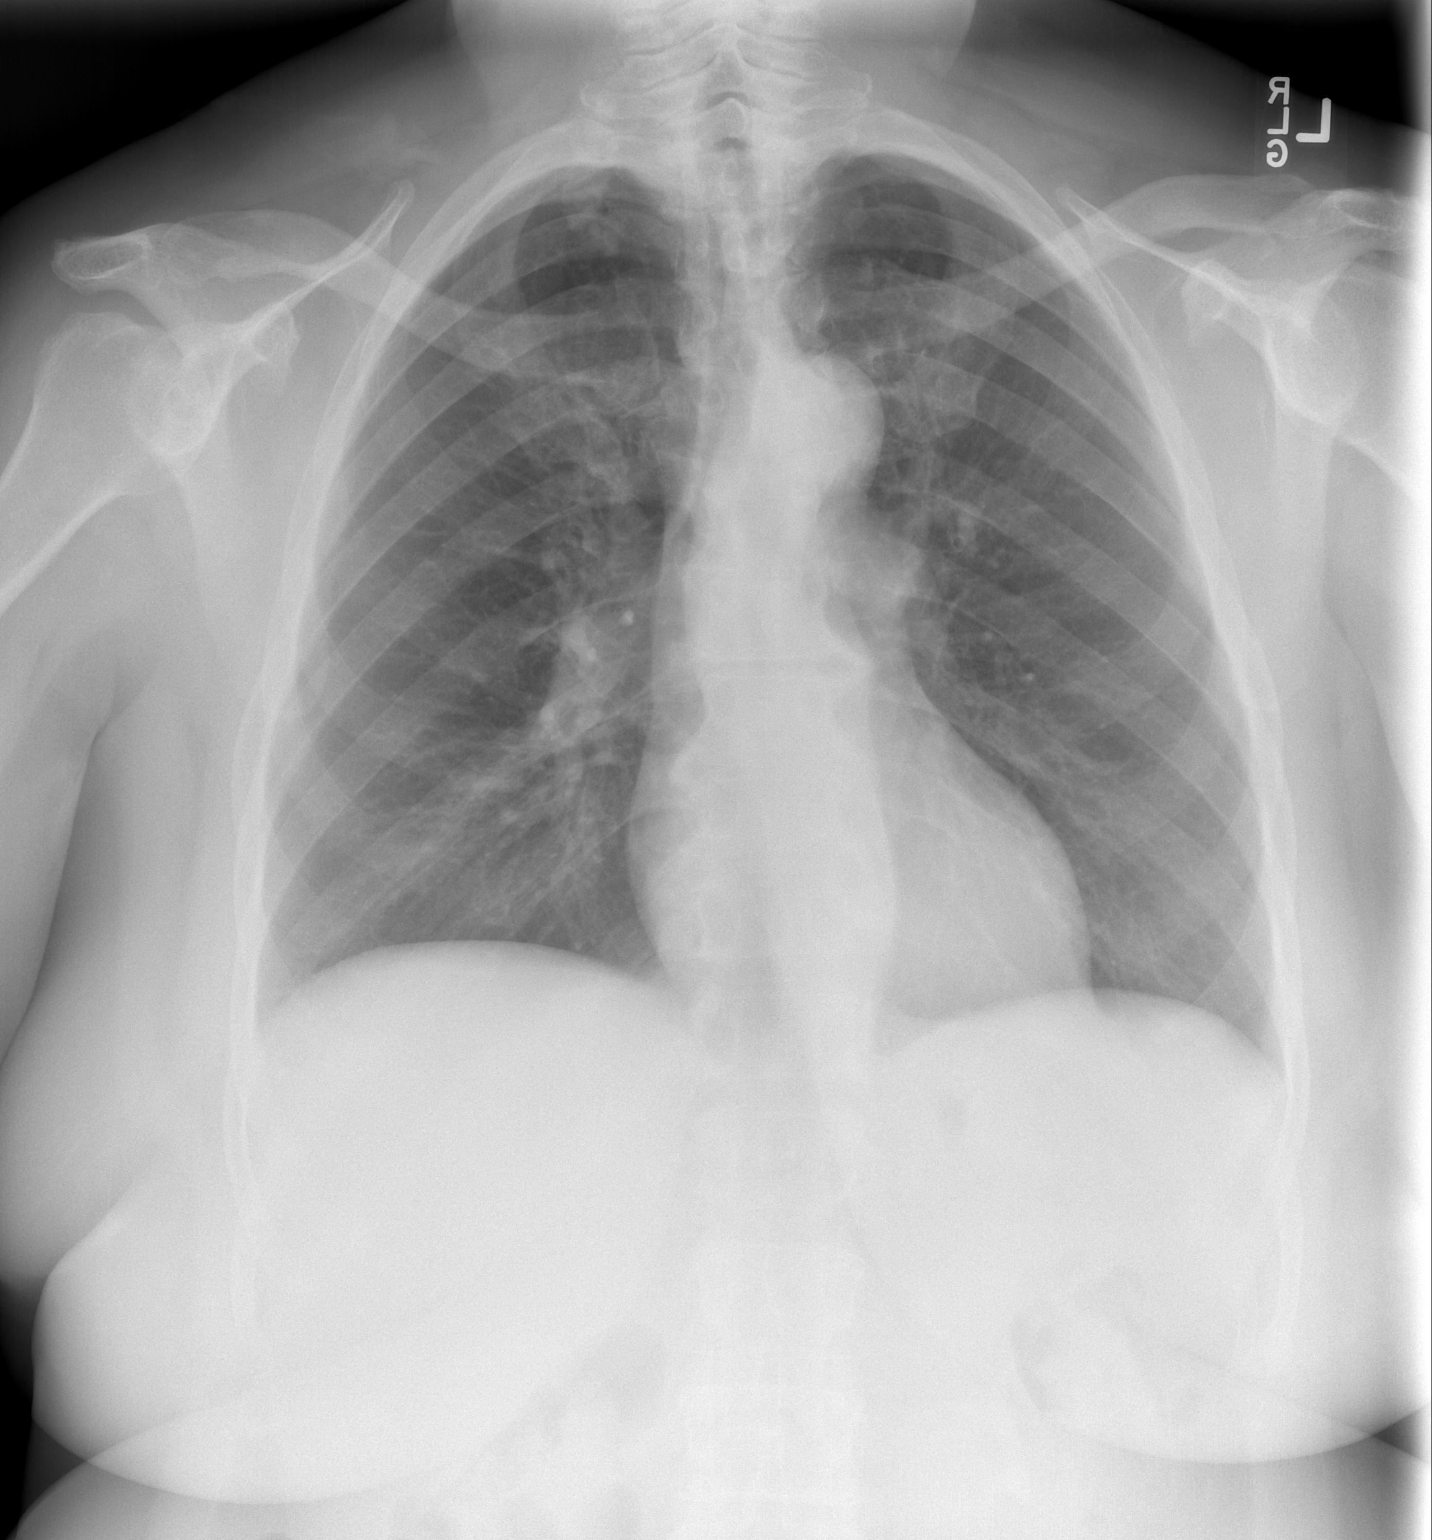

[w chest lat]
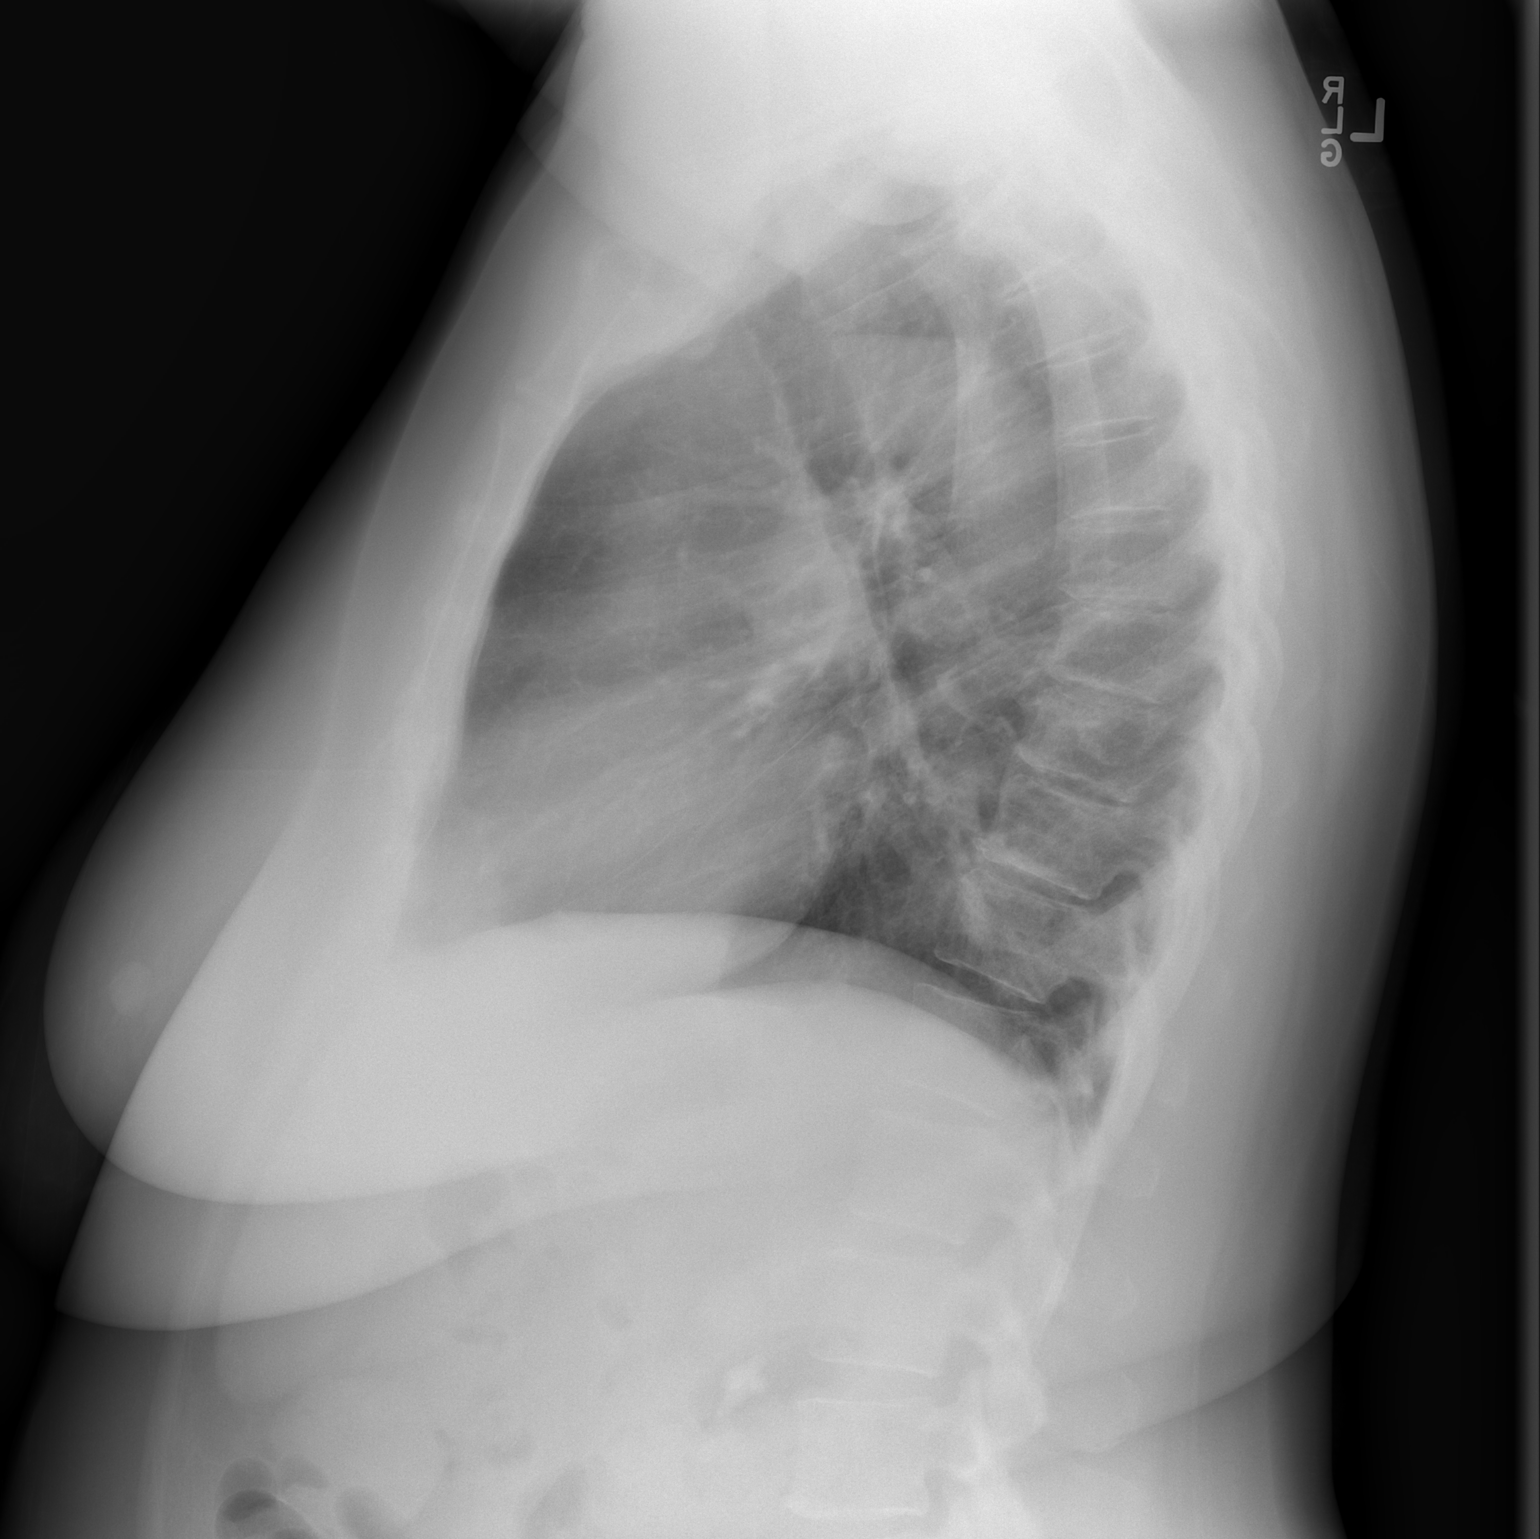

[2 of 2 positions shown; findings below may reference images not displayed]

FINDINGS: The lungs are well-expanded. There is no focal infiltrate. There is
new irregular density overlying the right pulmonary apex measuring
approximately 1.5 x 2 cm. No pulmonary parenchymal nodules or masses
are observed elsewhere. The heart and pulmonary vascularity are
normal. The mediastinum is normal in width. There is no pleural
effusion or pneumothorax. The bony thorax exhibits no acute
abnormality.
IMPRESSION: New density projecting over the right pulmonary apex measuring
approximately 1.5 x 2 cm. Further evaluation with chest CT scanning
is recommended to exclude interval development of a pulmonary
nodule.

No acute abnormality elsewhere.

These results will be called to the ordering clinician or
representative by the Radiologist Assistant, and communication
documented in the PACS or zVision Dashboard.

## 2019-11-03 ENCOUNTER — Other Ambulatory Visit: Payer: Self-pay

## 2019-11-03 ENCOUNTER — Encounter: Payer: Self-pay | Admitting: Family Medicine

## 2019-11-03 ENCOUNTER — Ambulatory Visit (INDEPENDENT_AMBULATORY_CARE_PROVIDER_SITE_OTHER): Payer: 59 | Admitting: Family Medicine

## 2019-11-03 VITALS — BP 139/79 | HR 94 | Temp 97.7°F | Ht 67.0 in | Wt 250.4 lb

## 2019-11-03 DIAGNOSIS — I10 Essential (primary) hypertension: Secondary | ICD-10-CM | POA: Diagnosis not present

## 2019-11-03 DIAGNOSIS — Z7189 Other specified counseling: Secondary | ICD-10-CM | POA: Diagnosis not present

## 2019-11-03 DIAGNOSIS — G479 Sleep disorder, unspecified: Secondary | ICD-10-CM | POA: Diagnosis not present

## 2019-11-03 MED ORDER — AMLODIPINE BESYLATE 2.5 MG PO TABS: 2.5000 mg | ORAL_TABLET | Freq: Every day | ORAL | 1 refills | Status: DC

## 2019-11-03 NOTE — Patient Instructions (Signed)
Unisom or Melatonin for sleep.

## 2019-11-03 NOTE — Progress Notes (Signed)
Assessment & Plan:  1. Essential hypertension - Well controlled on current regimen.  - amLODipine (NORVASC) 2.5 MG tablet; Take 1 tablet (2.5 mg total) by mouth daily.  Dispense: 90 tablet; Refill: 1 - CMP14+EGFR  2. Counseled about COVID-19 virus infection - Discussed there are no contraindications to her getting the COVID-19 vaccine.  3. Difficulty sleeping - Encouraged to try OTC Unisom or Melatonin.    Return in about 6 weeks (around 12/15/2019) for sleep (telephone).  Hendricks Limes, MSN, APRN, FNP-C [facility]  Subjective:    Patient ID: Jordan Harmon, female    DOB: 23-Aug-1960, 59 y.o.   MRN: [id number]  Patient Care Team: Loman Brooklyn, FNP as PCP - General (Family Medicine)   Chief Complaint:  Chief Complaint  Patient presents with  . Establish Care    jones patient. Patient has one kidney and wants to know if it is safe for her to take the [place].  Jordan Harmon Hypertension    HPI: Jordan Harmon is a 59 y.o. female presenting on 11/03/2019 for Establish Care (jones patient. Patient has one kidney and wants to know if it is safe for her to take the COVID.) and Hypertension  Patient is getting established with me as her PCP has left the practice. Her concerns today are:   1. Is it safe to take the COVID-19 vaccine?  2. What can she take to help with sleep?   Social history:  Relevant past medical, surgical, family and social history reviewed and updated as indicated. Interim medical history since our last visit reviewed.  Allergies and medications reviewed and updated.  DATA REVIEWED: CHART IN EPIC  ROS: Negative unless specifically indicated above in HPI.    Current Outpatient Medications:  .  amLODipine (NORVASC) 2.5 MG tablet, Take 1 tablet (2.5 mg total) by mouth daily., Disp: 90 tablet, Rfl: 1   Allergies  Allergen Reactions  . Penicillins Anaphylaxis    Has patient had a PCN reaction causing immediate rash,  facial/tongue/throat swelling, SOB or lightheadedness with hypotension: Yes Has patient had a PCN reaction causing severe rash involving mucus membranes or skin necrosis: No Has patient had a PCN reaction that required hospitalization: Yes Has patient had a PCN reaction occurring within the last 10 years: No If all of the above answers are "NO", then may proceed with Cephalosporin use. Jordan Harmon   Diona Fanti [Aspirin] Hives  . Latex Rash   Past Medical History:  Diagnosis Date  . Adrenal nodule ([place])   . Anemia   . Asthma    as child  . Depression   . Hypertension   . Obesity   . Pulmonary nodule   . Renal mass   . Restless leg syndrome     Past Surgical History:  Procedure Laterality Date  . ABDOMINAL HYSTERECTOMY    . NEPHRECTOMY     11/20/16  . ROBOT ASSISTED LAPAROSCOPIC NEPHRECTOMY Right 11/20/2016   Procedure: XI ROBOTIC ASSISTED LAPAROSCOPIC NEPHRECTOMY AND  TOTAL ADRENALECTOMY;  Surgeon: Alexis Frock, MD;  Location: [facility];  Service: Urology;  Laterality: Right;  . TUBAL LIGATION      Social History   Socioeconomic History  . Marital status: Widowed    Spouse name: Not on file  . Number of children: Not on file  . Years of education: Not on file  . Highest education level: Not on file  Occupational History  . Not on file  Tobacco Use  . Smoking status: Never  Smoker  . Smokeless tobacco: Never Used  Substance and Sexual Activity  . Alcohol use: No  . Drug use: No  . Sexual activity: Yes  Other Topics Concern  . Not on file  Social History Narrative  . Not on file   Social Determinants of Health   Financial Resource Strain:   . Difficulty of Paying Living Expenses:   Food Insecurity:   . Worried About Charity fundraiser in the Last Year:   . Arboriculturist in the Last Year:   Transportation Needs:   . Film/video editor (Medical):   Jordan Harmon Lack of Transportation (Non-Medical):   Physical Activity:   . Days of Exercise per Week:   . Minutes of Exercise per  Session:   Stress:   . Feeling of Stress :   Social Connections:   . Frequency of Communication with Friends and Family:   . Frequency of Social Gatherings with Friends and Family:   . Attends Religious Services:   . Active Member of Clubs or Organizations:   . Attends Archivist Meetings:   Jordan Harmon Marital Status:   Intimate Partner Violence:   . Fear of Current or Ex-Partner:   . Emotionally Abused:   Jordan Harmon Physically Abused:   . Sexually Abused:         Objective:    BP 139/79   Pulse 94   Temp 97.7 F (36.5 C) (Temporal)   Ht '5\' 7"'  (1.702 m)   Wt 250 lb 6.4 oz (113.6 kg)   SpO2 94%   BMI 39.22 kg/m   Wt Readings from Last 3 Encounters:  11/03/19 250 lb 6.4 oz (113.6 kg)  05/22/18 247 lb (112 kg)  12/31/17 234 lb 6.4 oz (106.3 kg)    Physical Exam Vitals reviewed.  Constitutional:      General: She is not in acute distress.    Appearance: Normal appearance. She is not ill-appearing, toxic-appearing or diaphoretic.  HENT:     Head: Normocephalic and atraumatic.  Eyes:     General: No scleral icterus.       Right eye: No discharge.        Left eye: No discharge.     Conjunctiva/sclera: Conjunctivae normal.  Cardiovascular:     Rate and Rhythm: Normal rate and regular rhythm.     Heart sounds: Normal heart sounds. No murmur. No friction rub. No gallop.   Pulmonary:     Effort: Pulmonary effort is normal. No respiratory distress.     Breath sounds: Normal breath sounds. No stridor. No wheezing, rhonchi or rales.  Musculoskeletal:        General: Normal range of motion.     Cervical back: Normal range of motion.  Skin:    General: Skin is warm and dry.     Capillary Refill: Capillary refill takes less than 2 seconds.  Neurological:     General: No focal deficit present.     Mental Status: She is alert and oriented to person, place, and time. Mental status is at baseline.  Psychiatric:        Mood and Affect: Mood normal.        Behavior: Behavior normal.         Thought Content: Thought content normal.        Judgment: Judgment normal.     Lab Results  Component Value Date   TSH 1.240 04/27/2019   Lab Results  Component Value Date   WBC 10.7  04/27/2019   HGB 12.2 04/27/2019   HCT 36.1 04/27/2019   MCV 82 04/27/2019   PLT 349 04/27/2019   Lab Results  Component Value Date   NA 142 11/03/2019   K 4.2 11/03/2019   CO2 21 11/03/2019   GLUCOSE 98 11/03/2019   BUN 18 11/03/2019   CREATININE 1.18 (H) 11/03/2019   BILITOT 0.2 11/03/2019   ALKPHOS 100 11/03/2019   AST 18 11/03/2019   ALT 18 11/03/2019   PROT 7.6 11/03/2019   ALBUMIN 4.4 11/03/2019   CALCIUM 9.3 11/03/2019   ANIONGAP 10 01/04/2017   Lab Results  Component Value Date   CHOL 190 04/27/2019   Lab Results  Component Value Date   HDL 47 04/27/2019   Lab Results  Component Value Date   LDLCALC 119 (H) 04/27/2019   Lab Results  Component Value Date   TRIG 134 04/27/2019   Lab Results  Component Value Date   CHOLHDL 4.0 04/27/2019   No results found for: HGBA1C

## 2019-11-04 LAB — CMP14+EGFR
ALT: 18 IU/L (ref 0–32)
AST: 18 IU/L (ref 0–40)
Albumin/Globulin Ratio: 1.4 (ref 1.2–2.2)
Albumin: 4.4 g/dL (ref 3.8–4.9)
Alkaline Phosphatase: 100 IU/L (ref 39–117)
BUN/Creatinine Ratio: 15 (ref 9–23)
BUN: 18 mg/dL (ref 6–24)
Bilirubin Total: 0.2 mg/dL (ref 0.0–1.2)
CO2: 21 mmol/L (ref 20–29)
Calcium: 9.3 mg/dL (ref 8.7–10.2)
Chloride: 104 mmol/L (ref 96–106)
Creatinine, Ser: 1.18 mg/dL — ABNORMAL HIGH (ref 0.57–1.00)
GFR calc Af Amer: 58 mL/min/{1.73_m2} — ABNORMAL LOW (ref >59)
GFR calc non Af Amer: 51 mL/min/{1.73_m2} — ABNORMAL LOW (ref >59)
Globulin, Total: 3.2 g/dL (ref 1.5–4.5)
Glucose: 98 mg/dL (ref 65–99)
Potassium: 4.2 mmol/L (ref 3.5–5.2)
Sodium: 142 mmol/L (ref 134–144)
Total Protein: 7.6 g/dL (ref 6.0–8.5)

## 2019-11-07 DIAGNOSIS — G479 Sleep disorder, unspecified: Secondary | ICD-10-CM | POA: Insufficient documentation

## 2019-12-16 ENCOUNTER — Ambulatory Visit (INDEPENDENT_AMBULATORY_CARE_PROVIDER_SITE_OTHER): Payer: 59 | Admitting: Family Medicine

## 2019-12-16 ENCOUNTER — Encounter: Payer: Self-pay | Admitting: Family Medicine

## 2019-12-16 DIAGNOSIS — G479 Sleep disorder, unspecified: Secondary | ICD-10-CM

## 2019-12-16 NOTE — Progress Notes (Signed)
   Virtual Visit via Telephone Note  I connected with Jordan Harmon on 12/16/19 at 2:10 PM by telephone and verified that I am speaking with the correct person using two identifiers. Jordan Harmon is currently located in her car and nobody is currently with her during this visit. The provider, Loman Brooklyn, FNP is located in their home at time of visit.  I discussed the limitations, risks, security and privacy concerns of performing an evaluation and management service by telephone and the availability of in person appointments. I also discussed with the patient that there may be a patient responsible charge related to this service. The patient expressed understanding and agreed to proceed.  Subjective: PCP: Loman Brooklyn, FNP  Chief Complaint  Patient presents with  . Insomnia   Patient is following up on difficulty sleeping.  She has been taking Unisom as needed when she feels she really needs to get some sleep.  She does not like to take anything on a regular basis.  She feels she is doing really good with this.   ROS: Per HPI  Current Outpatient Medications:  .  amLODipine (NORVASC) 2.5 MG tablet, Take 1 tablet (2.5 mg total) by mouth daily., Disp: 90 tablet, Rfl: 1  Allergies  Allergen Reactions  . Penicillins Anaphylaxis    Has patient had a PCN reaction causing immediate rash, facial/tongue/throat swelling, SOB or lightheadedness with hypotension: Yes Has patient had a PCN reaction causing severe rash involving mucus membranes or skin necrosis: No Has patient had a PCN reaction that required hospitalization: Yes Has patient had a PCN reaction occurring within the last 10 years: No If all of the above answers are "NO", then may proceed with Cephalosporin use. Marland Kitchen   Diona Fanti [Aspirin] Hives  . Latex Rash   Past Medical History:  Diagnosis Date  . Adrenal nodule ([place])   . Anemia   . Asthma    as child  . Depression   . Hypertension   . Obesity   . Pulmonary  nodule   . Renal mass   . Restless leg syndrome     Observations/Objective: A&O  No respiratory distress or wheezing audible over the phone Mood, judgement, and thought processes all WNL   Assessment and Plan: 1. Difficulty sleeping - Well controlled on current regimen (Unisom).    Follow Up Instructions: Return in about 6 months (around 06/17/2020) for annual physical.  I discussed the assessment and treatment plan with the patient. The patient was provided an opportunity to ask questions and all were answered. The patient agreed with the plan and demonstrated an understanding of the instructions.   The patient was advised to call back or seek an in-person evaluation if the symptoms worsen or if the condition fails to improve as anticipated.  The above assessment and management plan was discussed with the patient. The patient verbalized understanding of and has agreed to the management plan. Patient is aware to call the clinic if symptoms persist or worsen. Patient is aware when to return to the clinic for a follow-up visit. Patient educated on when it is appropriate to go to the emergency department.   Time call ended: 2:15 PM  I provided 7 minutes of non-face-to-face time during this encounter.  Hendricks Limes, MSN, APRN, FNP-C [facility] 12/16/19

## 2021-02-16 ENCOUNTER — Ambulatory Visit (INDEPENDENT_AMBULATORY_CARE_PROVIDER_SITE_OTHER): Payer: 59 | Admitting: Family Medicine

## 2021-02-16 ENCOUNTER — Other Ambulatory Visit: Payer: Self-pay

## 2021-02-16 ENCOUNTER — Encounter: Payer: Self-pay | Admitting: Family Medicine

## 2021-02-16 VITALS — BP 145/93 | HR 75 | Temp 97.2°F | Resp 20 | Ht 69.5 in | Wt 256.0 lb

## 2021-02-16 DIAGNOSIS — Z114 Encounter for screening for human immunodeficiency virus [HIV]: Secondary | ICD-10-CM | POA: Diagnosis not present

## 2021-02-16 DIAGNOSIS — G479 Sleep disorder, unspecified: Secondary | ICD-10-CM | POA: Diagnosis not present

## 2021-02-16 DIAGNOSIS — I10 Essential (primary) hypertension: Secondary | ICD-10-CM | POA: Diagnosis not present

## 2021-02-16 DIAGNOSIS — Z1159 Encounter for screening for other viral diseases: Secondary | ICD-10-CM | POA: Diagnosis not present

## 2021-02-16 MED ORDER — AMLODIPINE BESYLATE 2.5 MG PO TABS: 2.5000 mg | ORAL_TABLET | Freq: Every day | ORAL | 1 refills | Status: DC

## 2021-02-16 MED ORDER — TRAZODONE HCL 50 MG PO TABS: 50.0000 mg | ORAL_TABLET | Freq: Every evening | ORAL | 2 refills | Status: DC | PRN

## 2021-02-16 NOTE — Progress Notes (Signed)
Assessment & Plan:  1. Essential hypertension Uncontrolled without medication. Refilled amlodipine at previous dosage. - CBC with Differential/Platelet - CMP14+EGFR - Lipid panel - TSH - amLODipine (NORVASC) 2.5 MG tablet; Take 1 tablet (2.5 mg total) by mouth daily.  Dispense: 90 tablet; Refill: 1  2. Difficulty sleeping Uncontrolled. Failed treatment with OTC regimens. Starting Trazodone with directions to take 50 mg for at least the first three days, then may increase to 100 mg if needed. Education provided on quality sleep. - traZODone (DESYREL) 50 MG tablet; Take 1-2 tablets (50-100 mg total) by mouth at bedtime as needed for sleep.  Dispense: 30 tablet; Refill: 2  3. Encounter for hepatitis C screening test for low risk patient - Hepatitis C antibody (reflex, frozen specimen)  4. Encounter for screening for HIV - HIV antibody (with reflex)   Return in about 3 weeks (around 03/09/2021) for sleep (may be telephone).  Hendricks Limes, MSN, APRN, FNP-C [facility]  Subjective:    Patient ID: Jordan Harmon, female    DOB: 03-05-1961, 60 y.o.   MRN: [id number]  Patient Care Team: Loman Brooklyn, FNP as PCP - General (Family Medicine)   Chief Complaint:  Chief Complaint  Patient presents with   Medical Management of Chronic Issues    6 mo follow up    HPI: Jordan Harmon is a 60 y.o. female presenting on 02/16/2021 for Medical Management of Chronic Issues (6 mo follow up)  Hypertension: patient has been out of her medication for the past 4-6 months. She does sometimes feel dizzy but denies headaches or chest pain.  Patient reports she has been homeless, but now has a place to live.  New complaints: She is having a hard time sleeping. This has been going on since she watched her daughter pass away at home due to a heart condition in 2018. She has tried Unisom and Melatonin which were not effective.    Social history:  Relevant past  medical, surgical, family and social history reviewed and updated as indicated. Interim medical history since our last visit reviewed.  Allergies and medications reviewed and updated.  DATA REVIEWED: CHART IN EPIC  ROS: Negative unless specifically indicated above in HPI.    Current Outpatient Medications:    amLODipine (NORVASC) 2.5 MG tablet, Take 1 tablet (2.5 mg total) by mouth daily. (Patient not taking: Reported on 02/16/2021), Disp: 90 tablet, Rfl: 1   Allergies  Allergen Reactions   Penicillins Anaphylaxis    Has patient had a PCN reaction causing immediate rash, facial/tongue/throat swelling, SOB or lightheadedness with hypotension: Yes Has patient had a PCN reaction causing severe rash involving mucus membranes or skin necrosis: No Has patient had a PCN reaction that required hospitalization: Yes Has patient had a PCN reaction occurring within the last 10 years: No If all of the above answers are "NO", then may proceed with Cephalosporin use. Diona Fanti [Aspirin] Hives   Latex Rash   Past Medical History:  Diagnosis Date   Adrenal nodule ([place])    Anemia    Asthma    as child   Depression    Hypertension    Obesity    Pulmonary nodule    Renal mass    Restless leg syndrome     Past Surgical History:  Procedure Laterality Date   ABDOMINAL HYSTERECTOMY     NEPHRECTOMY     11/20/16   ROBOT ASSISTED LAPAROSCOPIC NEPHRECTOMY Right 11/20/2016   Procedure:  XI ROBOTIC ASSISTED LAPAROSCOPIC NEPHRECTOMY AND  TOTAL ADRENALECTOMY;  Surgeon: Alexis Frock, MD;  Location: [facility];  Service: Urology;  Laterality: Right;   TUBAL LIGATION      Social History   Socioeconomic History   Marital status: Widowed    Spouse name: Not on file   Number of children: Not on file   Years of education: Not on file   Highest education level: Not on file  Occupational History   Not on file  Tobacco Use   Smoking status: Never   Smokeless tobacco: Never  Vaping Use   Vaping Use: Never  used  Substance and Sexual Activity   Alcohol use: No   Drug use: No   Sexual activity: Yes  Other Topics Concern   Not on file  Social History Narrative   Not on file   Social Determinants of Health   Financial Resource Strain: Not on file  Food Insecurity: Not on file  Transportation Needs: Not on file  Physical Activity: Not on file  Stress: Not on file  Social Connections: Not on file  Intimate Partner Violence: Not on file        Objective:    BP (!) 145/93   Pulse 75   Temp (!) 97.2 F (36.2 C)   Resp 20   Ht 5' 9.5" (1.765 m)   Wt 256 lb (116.1 kg)   SpO2 98%   BMI 37.26 kg/m   Wt Readings from Last 3 Encounters:  02/16/21 256 lb (116.1 kg)  11/03/19 250 lb 6.4 oz (113.6 kg)  05/22/18 247 lb (112 kg)    Physical Exam Vitals reviewed.  Constitutional:      General: She is not in acute distress.    Appearance: Normal appearance. She is obese. She is not ill-appearing, toxic-appearing or diaphoretic.  HENT:     Head: Normocephalic and atraumatic.  Eyes:     General: No scleral icterus.       Right eye: No discharge.        Left eye: No discharge.     Conjunctiva/sclera: Conjunctivae normal.  Cardiovascular:     Rate and Rhythm: Normal rate and regular rhythm.     Heart sounds: Normal heart sounds. No murmur heard.   No friction rub. No gallop.  Pulmonary:     Effort: Pulmonary effort is normal. No respiratory distress.     Breath sounds: Normal breath sounds. No stridor. No wheezing, rhonchi or rales.  Musculoskeletal:        General: Normal range of motion.     Cervical back: Normal range of motion.  Skin:    General: Skin is warm and dry.     Capillary Refill: Capillary refill takes less than 2 seconds.  Neurological:     General: No focal deficit present.     Mental Status: She is alert and oriented to person, place, and time. Mental status is at baseline.  Psychiatric:        Mood and Affect: Mood normal.        Behavior: Behavior normal.         Thought Content: Thought content normal.        Judgment: Judgment normal.    Lab Results  Component Value Date   TSH 1.240 04/27/2019   Lab Results  Component Value Date   WBC 10.7 04/27/2019   HGB 12.2 04/27/2019   HCT 36.1 04/27/2019   MCV 82 04/27/2019   PLT 349 04/27/2019  Lab Results  Component Value Date   NA 142 11/03/2019   K 4.2 11/03/2019   CO2 21 11/03/2019   GLUCOSE 98 11/03/2019   BUN 18 11/03/2019   CREATININE 1.18 (H) 11/03/2019   BILITOT 0.2 11/03/2019   ALKPHOS 100 11/03/2019   AST 18 11/03/2019   ALT 18 11/03/2019   PROT 7.6 11/03/2019   ALBUMIN 4.4 11/03/2019   CALCIUM 9.3 11/03/2019   ANIONGAP 10 01/04/2017   Lab Results  Component Value Date   CHOL 190 04/27/2019   Lab Results  Component Value Date   HDL 47 04/27/2019   Lab Results  Component Value Date   LDLCALC 119 (H) 04/27/2019   Lab Results  Component Value Date   TRIG 134 04/27/2019   Lab Results  Component Value Date   CHOLHDL 4.0 04/27/2019   No results found for: HGBA1C

## 2021-02-17 DIAGNOSIS — E785 Hyperlipidemia, unspecified: Secondary | ICD-10-CM | POA: Insufficient documentation

## 2021-02-17 DIAGNOSIS — E782 Mixed hyperlipidemia: Secondary | ICD-10-CM | POA: Insufficient documentation

## 2021-02-17 HISTORY — DX: Hyperlipidemia, unspecified: E78.5

## 2021-02-17 LAB — CBC WITH DIFFERENTIAL/PLATELET
Basophils Absolute: 0.1 10*3/uL (ref 0.0–0.2)
Basos: 1 %
EOS (ABSOLUTE): 0.2 10*3/uL (ref 0.0–0.4)
Eos: 2 %
Hematocrit: 37.6 % (ref 34.0–46.6)
Hemoglobin: 12.4 g/dL (ref 11.1–15.9)
Immature Grans (Abs): 0 10*3/uL (ref 0.0–0.1)
Immature Granulocytes: 0 %
Lymphocytes Absolute: 2.8 10*3/uL (ref 0.7–3.1)
Lymphs: 29 %
MCH: 27.5 pg (ref 26.6–33.0)
MCHC: 33 g/dL (ref 31.5–35.7)
MCV: 83 fL (ref 79–97)
Monocytes Absolute: 0.5 10*3/uL (ref 0.1–0.9)
Monocytes: 6 %
Neutrophils Absolute: 6.1 10*3/uL (ref 1.4–7.0)
Neutrophils: 62 %
Platelets: 290 10*3/uL (ref 150–450)
RBC: 4.51 x10E6/uL (ref 3.77–5.28)
RDW: 13.4 % (ref 11.7–15.4)
WBC: 9.7 10*3/uL (ref 3.4–10.8)

## 2021-02-17 LAB — TSH: TSH: 0.92 u[IU]/mL (ref 0.450–4.500)

## 2021-02-17 LAB — CMP14+EGFR
ALT: 19 IU/L (ref 0–32)
AST: 26 IU/L (ref 0–40)
Albumin/Globulin Ratio: 1.2 (ref 1.2–2.2)
Albumin: 4.1 g/dL (ref 3.8–4.9)
Alkaline Phosphatase: 92 IU/L (ref 44–121)
BUN/Creatinine Ratio: 20 (ref 12–28)
BUN: 19 mg/dL (ref 8–27)
Bilirubin Total: 0.3 mg/dL (ref 0.0–1.2)
CO2: 21 mmol/L (ref 20–29)
Calcium: 9.4 mg/dL (ref 8.7–10.3)
Chloride: 105 mmol/L (ref 96–106)
Creatinine, Ser: 0.94 mg/dL (ref 0.57–1.00)
Globulin, Total: 3.4 g/dL (ref 1.5–4.5)
Glucose: 87 mg/dL (ref 65–99)
Potassium: 4.7 mmol/L (ref 3.5–5.2)
Sodium: 141 mmol/L (ref 134–144)
Total Protein: 7.5 g/dL (ref 6.0–8.5)
eGFR: 69 mL/min/{1.73_m2} (ref >59)

## 2021-02-17 LAB — HCV AB W REFLEX TO QUANT PCR: HCV Ab: 0.2 s/co ratio (ref 0.0–0.9)

## 2021-02-17 LAB — LIPID PANEL
Chol/HDL Ratio: 4.2 ratio (ref 0.0–4.4)
Cholesterol, Total: 203 mg/dL — ABNORMAL HIGH (ref 100–199)
HDL: 48 mg/dL (ref >39)
LDL Chol Calc (NIH): 132 mg/dL — ABNORMAL HIGH (ref 0–99)
Triglycerides: 128 mg/dL (ref 0–149)
VLDL Cholesterol Cal: 23 mg/dL (ref 5–40)

## 2021-02-17 LAB — HIV ANTIBODY (ROUTINE TESTING W REFLEX): HIV Screen 4th Generation wRfx: NONREACTIVE

## 2021-02-17 LAB — HCV INTERPRETATION

## 2021-02-18 ENCOUNTER — Encounter: Payer: Self-pay | Admitting: Family Medicine

## 2021-02-23 ENCOUNTER — Other Ambulatory Visit: Payer: Self-pay | Admitting: Family Medicine

## 2021-02-23 DIAGNOSIS — G479 Sleep disorder, unspecified: Secondary | ICD-10-CM

## 2021-02-23 NOTE — Telephone Encounter (Signed)
New prescription. Needs to follow-up to make sure medication is working and a dose change is not needed. Looks like she has an appointment scheduled later this month.

## 2021-02-23 NOTE — Progress Notes (Signed)
Pt aware.

## 2021-03-08 ENCOUNTER — Ambulatory Visit (INDEPENDENT_AMBULATORY_CARE_PROVIDER_SITE_OTHER): Payer: 59 | Admitting: Family Medicine

## 2021-03-08 ENCOUNTER — Encounter: Payer: Self-pay | Admitting: Family Medicine

## 2021-03-08 DIAGNOSIS — G479 Sleep disorder, unspecified: Secondary | ICD-10-CM

## 2021-03-08 DIAGNOSIS — I10 Essential (primary) hypertension: Secondary | ICD-10-CM | POA: Diagnosis not present

## 2021-03-08 DIAGNOSIS — E782 Mixed hyperlipidemia: Secondary | ICD-10-CM | POA: Diagnosis not present

## 2021-03-08 NOTE — Progress Notes (Signed)
Virtual Visit via Telephone Note  I connected with Jordan Harmon on 03/08/21 at 4:13 PM by telephone and verified that I am speaking with the correct person using two identifiers. Jordan Harmon is currently located at home and nobody is currently with her during this visit. The provider, Loman Brooklyn, FNP is located in their office at time of visit.  I discussed the limitations, risks, security and privacy concerns of performing an evaluation and management service by telephone and the availability of in person appointments. I also discussed with the patient that there may be a patient responsible charge related to this service. The patient expressed understanding and agreed to proceed.  Subjective: PCP: Loman Brooklyn, FNP  Chief Complaint  Patient presents with   Insomnia   Patient states she is sleeping good with Trazodone. However, she does not like the feeling the next morning. She is taking the medication at 7:30 PM and getting up for work at 2:30 PM.  Patient is getting a new blood pressure monitor for home use.  She is eating healthier since her cholesterol levels came back elevated.    ROS: Per HPI  Current Outpatient Medications:    amLODipine (NORVASC) 2.5 MG tablet, Take 1 tablet (2.5 mg total) by mouth daily., Disp: 90 tablet, Rfl: 1   traZODone (DESYREL) 50 MG tablet, Take 1-2 tablets (50-100 mg total) by mouth at bedtime as needed for sleep., Disp: 30 tablet, Rfl: 2  Allergies  Allergen Reactions   Penicillins Anaphylaxis    Has patient had a PCN reaction causing immediate rash, facial/tongue/throat swelling, SOB or lightheadedness with hypotension: Yes Has patient had a PCN reaction causing severe rash involving mucus membranes or skin necrosis: No Has patient had a PCN reaction that required hospitalization: Yes Has patient had a PCN reaction occurring within the last 10 years: No If all of the above answers are "NO", then may proceed with  Cephalosporin use. Jordan Harmon [Aspirin] Hives   Latex Rash   Past Medical History:  Diagnosis Date   Adrenal nodule ([place])    h/o adrenalectomy   Anemia    Asthma    as child   Depression    Hyperlipidemia 02/17/2021   Hypertension    Liver cyst    benign hemangioma   Obesity    Pulmonary nodule    benign work-up   Renal mass    renal cell carcinoma - h/o nephrectomy   Restless leg syndrome    Thyroid nodule     Observations/Objective: A&O  No respiratory distress or wheezing audible over the phone Mood, judgement, and thought processes all WNL   Assessment and Plan: 1. Difficulty sleeping Doing well with sleeping. Encouraged to take medication earlier to allow for 8 hours of sleep. May also try taking 25 mg instead of 50 mg to decrease next day effects.  2. Essential hypertension Encouraged to monitor at home. She is getting a new cuff.  3. Mixed hyperlipidemia She is eating healthier. Encouraged exercise. Discussed medication if numbers do not improve based on ASCVD risk score.   Follow Up Instructions: Return in about 3 months (around 06/08/2021) for annual physical.  I discussed the assessment and treatment plan with the patient. The patient was provided an opportunity to ask questions and all were answered. The patient agreed with the plan and demonstrated an understanding of the instructions.   The patient was advised to call back or seek an in-person evaluation if the  symptoms worsen or if the condition fails to improve as anticipated.  The above assessment and management plan was discussed with the patient. The patient verbalized understanding of and has agreed to the management plan. Patient is aware to call the clinic if symptoms persist or worsen. Patient is aware when to return to the clinic for a follow-up visit. Patient educated on when it is appropriate to go to the emergency department.   Time call ended: 4:24 PM  I provided 11 minutes of  non-face-to-face time during this encounter.  Hendricks Limes, MSN, APRN, FNP-C [facility] 03/08/21

## 2021-05-31 ENCOUNTER — Encounter: Payer: Self-pay | Admitting: Family Medicine

## 2021-06-13 ENCOUNTER — Encounter: Payer: 59 | Admitting: Family Medicine

## 2021-06-27 ENCOUNTER — Encounter: Payer: Self-pay | Admitting: Family Medicine

## 2021-06-27 ENCOUNTER — Ambulatory Visit (INDEPENDENT_AMBULATORY_CARE_PROVIDER_SITE_OTHER): Payer: 59 | Admitting: Family Medicine

## 2021-06-27 VITALS — BP 155/89 | HR 87 | Temp 97.4°F | Ht 69.5 in | Wt 248.6 lb

## 2021-06-27 DIAGNOSIS — Z0001 Encounter for general adult medical examination with abnormal findings: Secondary | ICD-10-CM

## 2021-06-27 DIAGNOSIS — I10 Essential (primary) hypertension: Secondary | ICD-10-CM | POA: Diagnosis not present

## 2021-06-27 DIAGNOSIS — E782 Mixed hyperlipidemia: Secondary | ICD-10-CM | POA: Diagnosis not present

## 2021-06-27 DIAGNOSIS — G479 Sleep disorder, unspecified: Secondary | ICD-10-CM

## 2021-06-27 DIAGNOSIS — Z0000 Encounter for general adult medical examination without abnormal findings: Secondary | ICD-10-CM

## 2021-06-27 DIAGNOSIS — D509 Iron deficiency anemia, unspecified: Secondary | ICD-10-CM

## 2021-06-27 MED ORDER — TRAZODONE HCL 50 MG PO TABS: 50.0000 mg | ORAL_TABLET | Freq: Every evening | ORAL | 3 refills | Status: DC | PRN

## 2021-06-27 MED ORDER — AMLODIPINE BESYLATE 5 MG PO TABS: 5.0000 mg | ORAL_TABLET | Freq: Every day | ORAL | 2 refills | Status: DC

## 2021-06-27 NOTE — Progress Notes (Signed)
Assessment & Plan:  1. Well adult exam - printed wellness information provided - CBC with Differential/Platelet - CMP14+EGFR - Lipid panel  2. Essential hypertension - poorly controlled, increase amlodipine from 2.5 mg to 5 mg daily - amLODipine (NORVASC) 5 MG tablet; Take 1 tablet (5 mg total) by mouth daily.  Dispense: 30 tablet; Refill: 2 - CBC with Differential/Platelet - CMP14+EGFR - Lipid panel  3. Mixed hyperlipidemia - agreeable to initiating medication if still high today - continue healthy diet and exercise - Lipid panel  4. Morbid obesity due to excess calories (HCC) - down 8 lbs from previous visit, encouraged continued healthy diet and exercise - CBC with Differential/Platelet - CMP14+EGFR - Lipid panel  5. Iron deficiency anemia, unspecified iron deficiency anemia type - CBC with Differential/Platelet  6. Difficulty sleeping - Well controlled on current regimen - traZODone (DESYREL) 50 MG tablet; Take 1-2 tablets (50-100 mg total) by mouth at bedtime as needed for sleep.  Dispense: 90 tablet; Refill: 3 - CMP14+EGFR   Follow-up: Return in about 6 weeks (around 08/08/2021) for HTN.   Lucile Crater, NP Student  I personally was present during the history, physical exam, and medical decision-making activities of this service and have verified that the service and findings are accurately documented in the nurse practitioner student's note.  Hendricks Limes, MSN, APRN, FNP-C [facility]   Subjective:  Patient ID: Jordan Harmon, female    DOB: 15-Dec-1960  Age: 60 y.o. MRN: [id number]  Patient Care Team: Loman Brooklyn, FNP as PCP - General (Family Medicine)   CC:  Chief Complaint  Patient presents with   Annual Exam    HPI Jordan Harmon presents for annual physical.  Hypertension: she is taking amlodipine 2.5 mg daily, but is taking it at night due to it making her have to urinate. She does not have a cuff to take readings  at home.   Insomnia: she is taking trazodone for this and states it helps.  HLD: She was noted to have elevate cholesterols on her lipid panel in July this year. She states she is working on eating healthy and being more active. She is down 8 lbs since July. She is agreeable to starting a medication if still high today.   The 10-year ASCVD risk score (Arnett DK, et al., 2019) is: 12.8%   Values used to calculate the score:     Age: 60 years     Sex: Female     Is Non-Hispanic African American: Yes     Diabetic: No     Tobacco smoker: No     Systolic Blood Pressure: 174 mmHg     Is BP treated: Yes     HDL Cholesterol: 48 mg/dL     Total Cholesterol: 203 mg/dL  Occupation: Biomedical scientist, Marital status: widowed, Substance use: none Diet: healthy diet, Exercise: on her feet constantly at work Last eye exam: not established with provider Last dental exam: not established with dentist Last colonoscopy: never, she states she will do next year Last mammogram: never, she states she will do next year Hepatitis C Screening: 01/2021 Immunizations: Flu Vaccine: up to date Tdap Vaccine: up to date  Shingrix Vaccine: declined COVID-19 Vaccine: up to date  DEPRESSION SCREENING Depression screen [facility] 2/9 06/27/2021 02/16/2021 11/07/2019  Decreased Interest 0 0 0  Down, Depressed, Hopeless 0 0 0  PHQ - 2 Score 0 0 0  Altered sleeping 0 2 2  Tired, decreased energy 0  0 0  Change in appetite 0 0 0  Feeling bad or failure about yourself  0 0 0  Trouble concentrating 0 0 0  Moving slowly or fidgety/restless 0 0 0  Suicidal thoughts 0 0 0  PHQ-9 Score 0 2 2  Difficult doing work/chores Not difficult at all Somewhat difficult Not difficult at all   GAD 7 : Generalized Anxiety Score 06/27/2021 02/16/2021 11/07/2019  Nervous, Anxious, on Edge 0 0 0  Control/stop worrying 0 0 0  Worry too much - different things 0 0 0  Trouble relaxing 0 2 0  Restless 0 0 0  Easily annoyed or irritable 0 0 0  Afraid - awful  might happen 0 0 0  Total GAD 7 Score 0 2 0  Anxiety Difficulty Not difficult at all Not difficult at all Not difficult at all    Review of Systems  Constitutional: Negative.  Negative for chills, fever, malaise/fatigue and weight loss.  HENT: Negative.  Negative for congestion, ear discharge, ear pain, hearing loss and sinus pain.   Eyes: Negative.  Negative for blurred vision, pain, discharge and redness.  Respiratory: Negative.  Negative for cough, shortness of breath and wheezing.   Cardiovascular:  Positive for leg swelling (some lower extremity edema if on her feet for hours). Negative for chest pain, palpitations and orthopnea.  Gastrointestinal: Negative.  Negative for abdominal pain, constipation, diarrhea, heartburn, nausea and vomiting.  Genitourinary: Negative.  Negative for dysuria, frequency and urgency.  Musculoskeletal: Negative.  Negative for back pain, falls, joint pain, myalgias and neck pain.  Skin: Negative.  Negative for itching and rash.  Neurological: Negative.  Negative for dizziness, tremors, weakness and headaches.  Endo/Heme/Allergies: Negative.  Negative for environmental allergies and polydipsia. Does not bruise/bleed easily.  Psychiatric/Behavioral: Negative.  Negative for depression, memory loss and substance abuse. The patient is not nervous/anxious and does not have insomnia.     Current Outpatient Medications:    amLODipine (NORVASC) 2.5 MG tablet, Take 1 tablet (2.5 mg total) by mouth daily., Disp: 90 tablet, Rfl: 1   traZODone (DESYREL) 50 MG tablet, Take 1-2 tablets (50-100 mg total) by mouth at bedtime as needed for sleep., Disp: 30 tablet, Rfl: 2  Allergies  Allergen Reactions   Penicillins Anaphylaxis    Has patient had a PCN reaction causing immediate rash, facial/tongue/throat swelling, SOB or lightheadedness with hypotension: Yes Has patient had a PCN reaction causing severe rash involving mucus membranes or skin necrosis: No Has patient had a  PCN reaction that required hospitalization: Yes Has patient had a PCN reaction occurring within the last 10 years: No If all of the above answers are "NO", then may proceed with Cephalosporin use. Diona Fanti [Aspirin] Hives   Latex Rash    Past Medical History:  Diagnosis Date   Adrenal nodule ([place])    h/o adrenalectomy   Anemia    Asthma    as child   Depression    Hyperlipidemia 02/17/2021   Hypertension    Liver cyst    benign hemangioma   Obesity    Pulmonary nodule    benign work-up   Renal mass    renal cell carcinoma - h/o nephrectomy   Restless leg syndrome    Thyroid nodule     Past Surgical History:  Procedure Laterality Date   NEPHRECTOMY     11/20/16   ROBOT ASSISTED LAPAROSCOPIC NEPHRECTOMY Right 11/20/2016   Procedure: XI ROBOTIC ASSISTED LAPAROSCOPIC NEPHRECTOMY AND  TOTAL ADRENALECTOMY;  Surgeon: Alexis Frock, MD;  Location: [facility];  Service: Urology;  Laterality: Right;   TOTAL ABDOMINAL HYSTERECTOMY     TUBAL LIGATION      Family History  Problem Relation Age of Onset   Diabetes Mother    Heart disease Mother     Social History   Socioeconomic History   Marital status: Widowed    Spouse name: Not on file   Number of children: Not on file   Years of education: Not on file   Highest education level: Not on file  Occupational History   Occupation: Chef    Comment: [employer]  Tobacco Use   Smoking status: Never   Smokeless tobacco: Never  Vaping Use   Vaping Use: Never used  Substance and Sexual Activity   Alcohol use: No   Drug use: No   Sexual activity: Yes  Other Topics Concern   Not on file  Social History Narrative   Not on file   Social Determinants of Health   Financial Resource Strain: Not on file  Food Insecurity: Not on file  Transportation Needs: Not on file  Physical Activity: Not on file  Stress: Not on file  Social Connections: Not on file  Intimate Partner Violence: Not on file      Objective:    BP  (!) 155/89   Pulse 87   Temp (!) 97.4 F (36.3 C) (Temporal)   Ht 5' 9.5" (1.765 m)   Wt 112.8 kg   SpO2 97%   BMI 36.19 kg/m   Wt Readings from Last 3 Encounters:  06/27/21 112.8 kg  02/16/21 116.1 kg  11/03/19 113.6 kg    Physical Exam Vitals reviewed.  Constitutional:      General: She is not in acute distress.    Appearance: Normal appearance. She is obese. She is not ill-appearing, toxic-appearing or diaphoretic.  HENT:     Head: Normocephalic and atraumatic.     Right Ear: Tympanic membrane, ear canal and external ear normal.     Left Ear: Tympanic membrane, ear canal and external ear normal.     Nose: Nose normal. No congestion.     Mouth/Throat:     Mouth: Mucous membranes are moist.     Pharynx: Oropharynx is clear. No oropharyngeal exudate or posterior oropharyngeal erythema.  Eyes:     Extraocular Movements: Extraocular movements intact.     Conjunctiva/sclera: Conjunctivae normal.     Pupils: Pupils are equal, round, and reactive to light.  Cardiovascular:     Rate and Rhythm: Normal rate and regular rhythm.     Pulses: Normal pulses.     Heart sounds: Normal heart sounds.  Pulmonary:     Effort: Pulmonary effort is normal. No respiratory distress.     Breath sounds: Normal breath sounds. No wheezing or rhonchi.  Abdominal:     General: Bowel sounds are normal. There is no distension.     Palpations: Abdomen is soft. There is no mass.  Musculoskeletal:        General: Normal range of motion.     Cervical back: Normal range of motion.  Skin:    General: Skin is warm and dry.  Neurological:     General: No focal deficit present.     Mental Status: She is alert and oriented to person, place, and time.     Motor: No weakness.     Gait: Gait normal.  Psychiatric:  Mood and Affect: Mood normal.        Behavior: Behavior normal.        Thought Content: Thought content normal.        Judgment: Judgment normal.    Lab Results  Component Value Date    TSH 0.920 02/16/2021   Lab Results  Component Value Date   WBC 9.7 02/16/2021   HGB 12.4 02/16/2021   HCT 37.6 02/16/2021   MCV 83 02/16/2021   PLT 290 02/16/2021   Lab Results  Component Value Date   NA 141 02/16/2021   K 4.7 02/16/2021   CO2 21 02/16/2021   GLUCOSE 87 02/16/2021   BUN 19 02/16/2021   CREATININE 0.94 02/16/2021   BILITOT 0.3 02/16/2021   ALKPHOS 92 02/16/2021   AST 26 02/16/2021   ALT 19 02/16/2021   PROT 7.5 02/16/2021   ALBUMIN 4.1 02/16/2021   CALCIUM 9.4 02/16/2021   ANIONGAP 10 01/04/2017   EGFR 69 02/16/2021   Lab Results  Component Value Date   CHOL 203 (H) 02/16/2021   Lab Results  Component Value Date   HDL 48 02/16/2021   Lab Results  Component Value Date   LDLCALC 132 (H) 02/16/2021   Lab Results  Component Value Date   TRIG 128 02/16/2021   Lab Results  Component Value Date   CHOLHDL 4.2 02/16/2021   No results found for: HGBA1C

## 2021-06-28 ENCOUNTER — Other Ambulatory Visit: Payer: Self-pay | Admitting: Family Medicine

## 2021-06-28 DIAGNOSIS — E782 Mixed hyperlipidemia: Secondary | ICD-10-CM

## 2021-06-28 LAB — CBC WITH DIFFERENTIAL/PLATELET
Basophils Absolute: 0 10*3/uL (ref 0.0–0.2)
Basos: 0 %
EOS (ABSOLUTE): 0.2 10*3/uL (ref 0.0–0.4)
Eos: 2 %
Hematocrit: 39.8 % (ref 34.0–46.6)
Hemoglobin: 12.9 g/dL (ref 11.1–15.9)
Immature Grans (Abs): 0 10*3/uL (ref 0.0–0.1)
Immature Granulocytes: 0 %
Lymphocytes Absolute: 2.7 10*3/uL (ref 0.7–3.1)
Lymphs: 25 %
MCH: 27.1 pg (ref 26.6–33.0)
MCHC: 32.4 g/dL (ref 31.5–35.7)
MCV: 84 fL (ref 79–97)
Monocytes Absolute: 0.6 10*3/uL (ref 0.1–0.9)
Monocytes: 5 %
Neutrophils Absolute: 7.2 10*3/uL — ABNORMAL HIGH (ref 1.4–7.0)
Neutrophils: 68 %
Platelets: 277 10*3/uL (ref 150–450)
RBC: 4.76 x10E6/uL (ref 3.77–5.28)
RDW: 13.5 % (ref 11.7–15.4)
WBC: 10.7 10*3/uL (ref 3.4–10.8)

## 2021-06-28 LAB — CMP14+EGFR
ALT: 18 IU/L (ref 0–32)
AST: 16 IU/L (ref 0–40)
Albumin/Globulin Ratio: 1.5 (ref 1.2–2.2)
Albumin: 4.5 g/dL (ref 3.8–4.9)
Alkaline Phosphatase: 90 IU/L (ref 44–121)
BUN/Creatinine Ratio: 14 (ref 12–28)
BUN: 14 mg/dL (ref 8–27)
Bilirubin Total: 0.3 mg/dL (ref 0.0–1.2)
CO2: 25 mmol/L (ref 20–29)
Calcium: 9.6 mg/dL (ref 8.7–10.3)
Chloride: 104 mmol/L (ref 96–106)
Creatinine, Ser: 0.98 mg/dL (ref 0.57–1.00)
Globulin, Total: 3.1 g/dL (ref 1.5–4.5)
Glucose: 80 mg/dL (ref 70–99)
Potassium: 4.4 mmol/L (ref 3.5–5.2)
Sodium: 141 mmol/L (ref 134–144)
Total Protein: 7.6 g/dL (ref 6.0–8.5)
eGFR: 66 mL/min/{1.73_m2} (ref >59)

## 2021-06-28 LAB — LIPID PANEL
Chol/HDL Ratio: 3.9 ratio (ref 0.0–4.4)
Cholesterol, Total: 193 mg/dL (ref 100–199)
HDL: 49 mg/dL (ref >39)
LDL Chol Calc (NIH): 124 mg/dL — ABNORMAL HIGH (ref 0–99)
Triglycerides: 112 mg/dL (ref 0–149)
VLDL Cholesterol Cal: 20 mg/dL (ref 5–40)

## 2021-06-28 MED ORDER — ROSUVASTATIN CALCIUM 5 MG PO TABS: 5.0000 mg | ORAL_TABLET | Freq: Every day | ORAL | 2 refills | Status: DC

## 2021-08-08 ENCOUNTER — Encounter: Payer: Self-pay | Admitting: Family Medicine

## 2021-08-08 ENCOUNTER — Ambulatory Visit (INDEPENDENT_AMBULATORY_CARE_PROVIDER_SITE_OTHER): Payer: 59 | Admitting: Family Medicine

## 2021-08-08 ENCOUNTER — Ambulatory Visit (INDEPENDENT_AMBULATORY_CARE_PROVIDER_SITE_OTHER): Payer: 59

## 2021-08-08 VITALS — BP 153/88 | HR 96 | Temp 97.9°F | Ht 69.5 in | Wt 248.2 lb

## 2021-08-08 DIAGNOSIS — G8929 Other chronic pain: Secondary | ICD-10-CM | POA: Diagnosis not present

## 2021-08-08 DIAGNOSIS — I10 Essential (primary) hypertension: Secondary | ICD-10-CM | POA: Diagnosis not present

## 2021-08-08 DIAGNOSIS — M25562 Pain in left knee: Secondary | ICD-10-CM | POA: Diagnosis not present

## 2021-08-08 MED ORDER — AMLODIPINE BESYLATE 10 MG PO TABS: 10.0000 mg | ORAL_TABLET | Freq: Every day | ORAL | 2 refills | Status: DC

## 2021-08-08 NOTE — Progress Notes (Signed)
Assessment & Plan:  1. Essential hypertension Uncontrolled. Amlodipine increased from 5 mg to 10 mg. Encouraged to continue monitoring blood pressure at home. - amLODipine (NORVASC) 10 MG tablet; Take 1 tablet (10 mg total) by mouth daily.  Dispense: 30 tablet; Refill: 2  2. Chronic pain of left knee X-ray today. Plan for possible knee injection next week. Continue Tylenol as needed for now. - DG Knee 1-2 Views Left   Return in about 6 weeks (around 09/19/2021) for HTN.  Hendricks Limes, MSN, APRN, FNP-C [facility]  Subjective:    Patient ID: Jordan Harmon, female    DOB: 16-May-1961, 61 y.o.   MRN: [id number]  Patient Care Team: Loman Brooklyn, FNP as PCP - General (Family Medicine)   Chief Complaint:  Chief Complaint  Patient presents with   Hypertension    6 week follow up     HPI: Jordan Harmon is a 61 y.o. female presenting on 08/08/2021 for Hypertension (6 week follow up )  Hypertension: she is taking amlodipine 5 mg daily. She is checking her blood pressure at home and reports readings 130-157/79-87. She feels her blood pressure is somewhat elevated due to chronic pain in her left knee. 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 She takes Tylenol which is not effective. She is unable to take NSAIDs as she only has one kidney.  New complaints: None   Social history:  Relevant past medical, surgical, family and social history reviewed and updated as indicated. Interim medical history since our last visit reviewed.  Allergies and medications reviewed and updated.  DATA REVIEWED: CHART IN EPIC  ROS: Negative unless specifically indicated above in HPI.    Current Outpatient Medications:    amLODipine (NORVASC) 5 MG tablet, Take 1 tablet (5 mg total) by mouth daily., Disp: 30 tablet, Rfl: 2   rosuvastatin (CRESTOR) 5 MG tablet, Take 1 tablet (5 mg total) by mouth daily., Disp: 30 tablet, Rfl: 2   traZODone (DESYREL) 50 MG tablet,  Take 1-2 tablets (50-100 mg total) by mouth at bedtime as needed for sleep., Disp: 90 tablet, Rfl: 3   Allergies  Allergen Reactions   Penicillins Anaphylaxis    Has patient had a PCN reaction causing immediate rash, facial/tongue/throat swelling, SOB or lightheadedness with hypotension: Yes Has patient had a PCN reaction causing severe rash involving mucus membranes or skin necrosis: No Has patient had a PCN reaction that required hospitalization: Yes Has patient had a PCN reaction occurring within the last 10 years: No If all of the above answers are "NO", then may proceed with Cephalosporin use. Diona Fanti [Aspirin] Hives   Latex Rash   Past Medical History:  Diagnosis Date   Adrenal nodule ([place])    h/o adrenalectomy   Anemia    Asthma    as child   Depression    Hyperlipidemia 02/17/2021   Hypertension    Liver cyst    benign hemangioma   Obesity    Pulmonary nodule    benign work-up   Renal mass    renal cell carcinoma - h/o nephrectomy   Restless leg syndrome    Thyroid nodule     Past Surgical History:  Procedure Laterality Date   NEPHRECTOMY     11/20/16   ROBOT ASSISTED LAPAROSCOPIC NEPHRECTOMY Right 11/20/2016   Procedure: XI ROBOTIC ASSISTED LAPAROSCOPIC NEPHRECTOMY AND  TOTAL ADRENALECTOMY;  Surgeon: Alexis Frock, MD;  Location: [facility];  Service: Urology;  Laterality: Right;   TOTAL ABDOMINAL HYSTERECTOMY     TUBAL LIGATION      Social History   Socioeconomic History   Marital status: Widowed    Spouse name: Not on file   Number of children: Not on file   Years of education: Not on file   Highest education level: Not on file  Occupational History   Occupation: Chef    Comment: [employer]  Tobacco Use   Smoking status: Never   Smokeless tobacco: Never  Vaping Use   Vaping Use: Never used  Substance and Sexual Activity   Alcohol use: No   Drug use: No   Sexual activity: Yes  Other Topics Concern   Not on file  Social History Narrative    Not on file   Social Determinants of Health   Financial Resource Strain: Not on file  Food Insecurity: Not on file  Transportation Needs: Not on file  Physical Activity: Not on file  Stress: Not on file  Social Connections: Not on file  Intimate Partner Violence: Not on file        Objective:    BP (!) 153/88    Pulse 96    Temp 97.9 F (36.6 C) (Temporal)    Ht 5' 9.5" (1.765 m)    Wt 248 lb 3.2 oz (112.6 kg)    SpO2 97%    BMI 36.13 kg/m   Wt Readings from Last 3 Encounters:  08/08/21 248 lb 3.2 oz (112.6 kg)  06/27/21 248 lb 9.6 oz (112.8 kg)  02/16/21 256 lb (116.1 kg)    Physical Exam Vitals reviewed.  Constitutional:      General: She is not in acute distress.    Appearance: Normal appearance. She is not ill-appearing, toxic-appearing or diaphoretic.  HENT:     Head: Normocephalic and atraumatic.  Eyes:     General: No scleral icterus.       Right eye: No discharge.        Left eye: No discharge.     Conjunctiva/sclera: Conjunctivae normal.  Cardiovascular:     Rate and Rhythm: Normal rate and regular rhythm.     Heart sounds: Normal heart sounds. No murmur heard.   No friction rub. No gallop.  Pulmonary:     Effort: Pulmonary effort is normal. No respiratory distress.     Breath sounds: Normal breath sounds. No stridor. No wheezing, rhonchi or rales.  Musculoskeletal:        General: Normal range of motion.     Cervical back: Normal range of motion.     Left knee: Tenderness present.  Skin:    General: Skin is warm and dry.     Capillary Refill: Capillary refill takes less than 2 seconds.  Neurological:     General: No focal deficit present.     Mental Status: She is alert and oriented to person, place, and time. Mental status is at baseline.  Psychiatric:        Mood and Affect: Mood normal.        Behavior: Behavior normal.        Thought Content: Thought content normal.        Judgment: Judgment normal.    Lab Results  Component Value Date    TSH 0.920 02/16/2021   Lab Results  Component Value Date   WBC 10.7 06/27/2021   HGB 12.9 06/27/2021   HCT 39.8 06/27/2021   MCV 84 06/27/2021  PLT 277 06/27/2021   Lab Results  Component Value Date   NA 141 06/27/2021   K 4.4 06/27/2021   CO2 25 06/27/2021   GLUCOSE 80 06/27/2021   BUN 14 06/27/2021   CREATININE 0.98 06/27/2021   BILITOT 0.3 06/27/2021   ALKPHOS 90 06/27/2021   AST 16 06/27/2021   ALT 18 06/27/2021   PROT 7.6 06/27/2021   ALBUMIN 4.5 06/27/2021   CALCIUM 9.6 06/27/2021   ANIONGAP 10 01/04/2017   EGFR 66 06/27/2021   Lab Results  Component Value Date   CHOL 193 06/27/2021   Lab Results  Component Value Date   HDL 49 06/27/2021   Lab Results  Component Value Date   LDLCALC 124 (H) 06/27/2021   Lab Results  Component Value Date   TRIG 112 06/27/2021   Lab Results  Component Value Date   CHOLHDL 3.9 06/27/2021   No results found for: HGBA1C

## 2021-09-17 ENCOUNTER — Encounter: Payer: Self-pay | Admitting: Orthopedic Surgery

## 2021-09-17 ENCOUNTER — Ambulatory Visit (INDEPENDENT_AMBULATORY_CARE_PROVIDER_SITE_OTHER): Payer: 59 | Admitting: Orthopedic Surgery

## 2021-09-17 VITALS — BP 143/75 | HR 100 | Ht 69.5 in | Wt 248.0 lb

## 2021-09-17 DIAGNOSIS — M25562 Pain in left knee: Secondary | ICD-10-CM

## 2021-09-17 NOTE — Patient Instructions (Addendum)
Instructions Following Joint Injections  In clinic today, you received an injection in one of your joints (sometimes more than one).  Occasionally, you can have some pain at the injection site, this is normal.  You can place ice at the injection site, or take over-the-counter medications such as Tylenol (acetaminophen) or Advil (ibuprofen).  Please follow all directions listed on the bottle.  If your joint (knee or shoulder) becomes swollen, red or very painful, please contact the clinic for additional assistance.   Two medications were injected, including lidocaine and a steroid (often referred to as cortisone).  Lidocaine is effective almost immediately but wears off quickly.  However, the steroid can take a few days to improve your symptoms.  In some cases, it can make your pain worse for a couple of days.  Do not be concerned if this happens as it is common.  You can apply ice or take some over-the-counter medications as needed.      Knee Exercises  Ask your health care provider which exercises are safe for you. Do exercises exactly as told by your health care provider and adjust them as directed. It is normal to feel mild stretching, pulling, tightness, or discomfort as you do these exercises. Stop right away if you feel sudden pain or your pain gets worse. Do not begin these exercises until told by your health care provider.  Stretching and range-of-motion exercises These exercises warm up your muscles and joints and improve the movement and flexibility of your knee. These exercises also help to relieve pain and swelling.  Knee extension, prone Lie on your abdomen (prone position) on a bed. Place your left / right knee just beyond the edge of the surface so your knee is not on the bed. You can put a towel under your left / right thigh just above your kneecap for comfort. Relax your leg muscles and allow gravity to straighten your knee (extension). You should feel a stretch behind your left  / right knee. Hold this position for 10 seconds. Scoot up so your knee is supported between repetitions. Repeat 10 times. Complete this exercise 3-4 times per week.     Knee flexion, active Lie on your back with both legs straight. If this causes back discomfort, bend your left / right knee so your foot is flat on the floor. Slowly slide your left / right heel back toward your buttocks. Stop when you feel a gentle stretch in the front of your knee or thigh (flexion). Hold this position for 10 seconds. Slowly slide your left / right heel back to the starting position. Repeat 10 times. Complete this exercise 3-4 times per week.      Quadriceps stretch, prone Lie on your abdomen on a firm surface, such as a bed or padded floor. Bend your left / right knee and hold your ankle. If you cannot reach your ankle or pant leg, loop a belt around your foot and grab the belt instead. Gently pull your heel toward your buttocks. Your knee should not slide out to the side. You should feel a stretch in the front of your thigh and knee (quadriceps). Hold this position for 10 seconds. Repeat 10 times. Complete this exercise 3-4 times per week.      Hamstring, supine Lie on your back (supine position). Loop a belt or towel over the ball of your left / right foot. The ball of your foot is on the walking surface, right under your toes. Straighten your left /   right knee and slowly pull on the belt to raise your leg until you feel a gentle stretch behind your knee (hamstring). Do not let your knee bend while you do this. Keep your other leg flat on the floor. Hold this position for 10 seconds. Repeat 10 times. Complete this exercise 3-4 times per week.   Strengthening exercises These exercises build strength and endurance in your knee. Endurance is the ability to use your muscles for a long time, even after they get tired.  Quadriceps, isometric This exercise stretches the muscles in front of your thigh  (quadriceps) without moving your knee joint (isometric). Lie on your back with your left / right leg extended and your other knee bent. Put a rolled towel or small pillow under your knee if told by your health care provider. Slowly tense the muscles in the front of your left / right thigh. You should see your kneecap slide up toward your hip or see increased dimpling just above the knee. This motion will push the back of the knee toward the floor. For 10 seconds, hold the muscle as tight as you can without increasing your pain. Relax the muscles slowly and completely. Repeat 10 times. Complete this exercise 3-4 times per week. .     Straight leg raises This exercise stretches the muscles in front of your thigh (quadriceps) and the muscles that move your hips (hip flexors). Lie on your back with your left / right leg extended and your other knee bent. Tense the muscles in the front of your left / right thigh. You should see your kneecap slide up or see increased dimpling just above the knee. Your thigh may even shake a bit. Keep these muscles tight as you raise your leg 4-6 inches (10-15 cm) off the floor. Do not let your knee bend. Hold this position for 10 seconds. Keep these muscles tense as you lower your leg. Relax your muscles slowly and completely after each repetition. Repeat 10 times. Complete this exercise 3-4 times per week.  Hamstring, isometric Lie on your back on a firm surface. Bend your left / right knee about 30 degrees. Dig your left / right heel into the surface as if you are trying to pull it toward your buttocks. Tighten the muscles in the back of your thighs (hamstring) to "dig" as hard as you can without increasing any pain. Hold this position for 10 seconds. Release the tension gradually and allow your muscles to relax completely for __________ seconds after each repetition. Repeat 10 times. Complete this exercise 3-4 times per week.  Hamstring curls If told by your  health care provider, do this exercise while wearing ankle weights. Begin with 5 lb weights. Then increase the weight by 1 lb (0.5 kg) increments. You can also use an exercise band Lie on your abdomen with your legs straight. Bend your left / right knee as far as you can without feeling pain. Keep your hips flat against the floor. Hold this position for 10 seconds. Slowly lower your leg to the starting position. Repeat 10 times. Complete this exercise 3-4 times per week.      Squats This exercise strengthens the muscles in front of your thigh and knee (quadriceps). Stand in front of a table, with your feet and knees pointing straight ahead. You may rest your hands on the table for balance but not for support. Slowly bend your knees and lower your hips like you are going to sit in a chair. Keep   your weight over your heels, not over your toes. Keep your lower legs upright so they are parallel with the table legs. Do not let your hips go lower than your knees. Do not bend lower than told by your health care provider. If your knee pain increases, do not bend as low. Hold the squat position for 10 seconds. Slowly push with your legs to return to standing. Do not use your hands to pull yourself to standing. Repeat 10 times. Complete this exercise 3-4 times per week .     Wall slides This exercise strengthens the muscles in front of your thigh and knee (quadriceps). Lean your back against a smooth wall or door, and walk your feet out 18-24 inches (46-61 cm) from it. Place your feet hip-width apart. Slowly slide down the wall or door until your knees bend 90 degrees. Keep your knees over your heels, not over your toes. Keep your knees in line with your hips. Hold this position for 10 seconds. Repeat 10 times. Complete this exercise 3-4 times per week.      Straight leg raises This exercise strengthens the muscles that rotate the leg at the hip and move it away from your body (hip  abductors). Lie on your side with your left / right leg in the top position. Lie so your head, shoulder, knee, and hip line up. You may bend your bottom knee to help you keep your balance. Roll your hips slightly forward so your hips are stacked directly over each other and your left / right knee is facing forward. Leading with your heel, lift your top leg 4-6 inches (10-15 cm). You should feel the muscles in your outer hip lifting. Do not let your foot drift forward. Do not let your knee roll toward the ceiling. Hold this position for 10 seconds. Slowly return your leg to the starting position. Let your muscles relax completely after each repetition. Repeat 10 times. Complete this exercise 3-4 times per week.      Straight leg raises This exercise stretches the muscles that move your hips away from the front of the pelvis (hip extensors). Lie on your abdomen on a firm surface. You can put a pillow under your hips if that is more comfortable. Tense the muscles in your buttocks and lift your left / right leg about 4-6 inches (10-15 cm). Keep your knee straight as you lift your leg. Hold this position for 10 seconds. Slowly lower your leg to the starting position. Let your leg relax completely after each repetition. Repeat 10 times. Complete this exercise 3-4 times per week.  

## 2021-09-17 NOTE — Progress Notes (Signed)
New Patient Visit  Assessment: Jordan Harmon is a 61 y.o. female with the following: 1. Acute pain of left knee   Plan: Pain in the left knee.  Stepped out of ned 2 months ago, started to have pain.  It has progressively worsened.  She is interested in an injection.  This was completed today.  She will continue to take tylenol, use voltaren gel and consider a brace.   Procedure note injection Left knee joint   Verbal consent was obtained to aspirate and inject the left knee joint  Timeout was completed to confirm the site of injection.   The skin was prepped with alcohol and ethyl chloride was sprayed at the injection site.  An 18-gauge needle was used to aspirate the knee. We were unable to aspirate any fluid and she did not tolerate the aspiration well.  We then injected 40 mg of Depo-Medrol and 1% lidocaine (4 cc) into the left knee using an anterolateral approach.  There were no complications. A sterile bandage was applied.      Follow-up: Return if symptoms worsen or fail to improve.  Subjective:  Chief Complaint  Patient presents with   Knee Pain    Left knee pain, NKI, + swelling in leg down to foot/ankle.  Bothering her for a few months.     History of Present Illness: Jordan Harmon is a 61 y.o. female who presents to clinic today for evaluation of left knee pain.  She has had pain in her left knee for the past 2 months.  Atraumatic onset.  She states she stepped out of bed, and then started to have pain.  Since then, the pain is progressively worsened.  The pain is diffuse.  She cannot take ibuprofen, but has taken some Tylenol.  Before this happened, she states that she walked on a regular basis, but she has difficulty now.  She is not using a brace.  No physical therapy.  She has not had an injection.   Review of Systems: No fevers or chills No numbness or tingling No chest pain No shortness of breath No bowel or bladder dysfunction No GI distress No  headaches   Medical History:  Past Medical History:  Diagnosis Date   Adrenal nodule ([place])    h/o adrenalectomy   Anemia    Asthma    as child   Depression    Hyperlipidemia 02/17/2021   Hypertension    Liver cyst    benign hemangioma   Obesity    Pulmonary nodule    benign work-up   Renal mass    renal cell carcinoma - h/o nephrectomy   Restless leg syndrome    Thyroid nodule     Past Surgical History:  Procedure Laterality Date   NEPHRECTOMY     11/20/16   ROBOT ASSISTED LAPAROSCOPIC NEPHRECTOMY Right 11/20/2016   Procedure: XI ROBOTIC ASSISTED LAPAROSCOPIC NEPHRECTOMY AND  TOTAL ADRENALECTOMY;  Surgeon: Alexis Frock, MD;  Location: [facility];  Service: Urology;  Laterality: Right;   TOTAL ABDOMINAL HYSTERECTOMY     TUBAL LIGATION      Family History  Problem Relation Age of Onset   Diabetes Mother    Heart disease Mother    Social History   Tobacco Use   Smoking status: Never   Smokeless tobacco: Never  Vaping Use   Vaping Use: Never used  Substance Use Topics   Alcohol use: No   Drug use: No    Allergies  Allergen  Reactions   Penicillins Anaphylaxis    Has patient had a PCN reaction causing immediate rash, facial/tongue/throat swelling, SOB or lightheadedness with hypotension: Yes Has patient had a PCN reaction causing severe rash involving mucus membranes or skin necrosis: No Has patient had a PCN reaction that required hospitalization: Yes Has patient had a PCN reaction occurring within the last 10 years: No If all of the above answers are "NO", then may proceed with Cephalosporin use. Diona Fanti [Aspirin] Hives   Latex Rash    Current Meds  Medication Sig   amLODipine (NORVASC) 10 MG tablet Take 1 tablet (10 mg total) by mouth daily.   rosuvastatin (CRESTOR) 5 MG tablet Take 1 tablet (5 mg total) by mouth daily.   traZODone (DESYREL) 50 MG tablet Take 1-2 tablets (50-100 mg total) by mouth at bedtime as needed for sleep.    Objective: BP  (!) 143/75    Pulse 100    Ht 5' 9.5" (1.765 m)    Wt 248 lb (112.5 kg)    BMI 36.10 kg/m   Physical Exam:  General: Alert and oriented. and No acute distress. Gait: Left sided antalgic gait.  Left knee with a mild effusion.  Range of motion is limited from 0-100 degrees.  She is tenderness to palpation along the medial lateral joint line.  No bruising is appreciated.  No increased laxity varus or valgus stress.  IMAGING: I personally reviewed images previously obtained in clinic  X-ray of the left knee demonstrates neutral overall alignment.  Mild to moderate loss of joint space within the medial compartment.   New Medications:  No orders of the defined types were placed in this encounter.     Mordecai Rasmussen, MD  09/17/2021 1:51 PM

## 2021-09-27 ENCOUNTER — Ambulatory Visit: Payer: 59 | Admitting: Family Medicine

## 2021-10-01 ENCOUNTER — Ambulatory Visit: Payer: Self-pay | Admitting: Orthopedic Surgery

## 2021-10-02 ENCOUNTER — Encounter: Payer: Self-pay | Admitting: Family Medicine

## 2021-10-02 ENCOUNTER — Ambulatory Visit (INDEPENDENT_AMBULATORY_CARE_PROVIDER_SITE_OTHER): Payer: 59 | Admitting: Family Medicine

## 2021-10-02 VITALS — BP 136/80 | HR 99 | Temp 97.9°F | Ht 69.5 in | Wt 252.6 lb

## 2021-10-02 DIAGNOSIS — E782 Mixed hyperlipidemia: Secondary | ICD-10-CM

## 2021-10-02 DIAGNOSIS — I10 Essential (primary) hypertension: Secondary | ICD-10-CM | POA: Diagnosis not present

## 2021-10-02 NOTE — Progress Notes (Signed)
? ?Assessment & Plan:  ?1. Essential hypertension ?Well controlled on current regimen.  ?- CMP14+EGFR ?- Lipid panel ? ?2. Mixed hyperlipidemia ?Labs to assess for improvement since adding rosuvastatin. ?- CMP14+EGFR ?- Lipid panel ? ? ?Return on/after 06/27/2022, for annual physical. ? ?Hendricks Limes, MSN, APRN, FNP-C ?[place] ? ?Subjective:  ? ? Patient ID: Jordan Harmon, female    DOB: 1961/06/20, 61 y.o.   MRN: [id number] ? ?Patient Care Team: ?Loman Brooklyn, FNP as PCP - General (Family Medicine)  ? ?Chief Complaint:  ?Chief Complaint  ?Patient presents with  ? Hypertension  ?  6 week re check   ? ? ?HPI: ?Jordan Harmon is a 61 y.o. female presenting on 10/02/2021 for Hypertension (6 week re check ) ? ?Hypertension: she is taking amlodipine 5 mg daily. She is checking her blood pressure at home and reports readings 137-154/64-74. She states most of her readings are < 427 systolic.  ? ?New complaints: ?None ? ? ?Social history: ? ?Relevant past medical, surgical, family and social history reviewed and updated as indicated. Interim medical history since our last visit reviewed. ? ?Allergies and medications reviewed and updated. ? ?DATA REVIEWED: CHART IN EPIC ? ?ROS: Negative unless specifically indicated above in HPI.  ? ? ?Current Outpatient Medications:  ?  amLODipine (NORVASC) 10 MG tablet, Take 1 tablet (10 mg total) by mouth daily., Disp: 30 tablet, Rfl: 2 ?  rosuvastatin (CRESTOR) 5 MG tablet, Take 1 tablet (5 mg total) by mouth daily., Disp: 30 tablet, Rfl: 2 ?  traZODone (DESYREL) 50 MG tablet, Take 1-2 tablets (50-100 mg total) by mouth at bedtime as needed for sleep., Disp: 90 tablet, Rfl: 3  ? ?Allergies  ?Allergen Reactions  ? Penicillins Anaphylaxis  ?  Has patient had a PCN reaction causing immediate rash, facial/tongue/throat swelling, SOB or lightheadedness with hypotension: Yes ?Has patient had a PCN reaction causing severe rash involving mucus membranes or skin  necrosis: No ?Has patient had a PCN reaction that required hospitalization: Yes ?Has patient had a PCN reaction occurring within the last 10 years: No ?If all of the above answers are "NO", then may proceed with Cephalosporin use. ?. ?  ? Asa [Aspirin] Hives  ? Latex Rash  ? ?Past Medical History:  ?Diagnosis Date  ? Adrenal nodule [facility])   ? h/o adrenalectomy  ? Anemia   ? Asthma   ? as child  ? Depression   ? Hyperlipidemia 02/17/2021  ? Hypertension   ? Liver cyst   ? benign hemangioma  ? Obesity   ? Pulmonary nodule   ? benign work-up  ? Renal mass   ? renal cell carcinoma - h/o nephrectomy  ? Restless leg syndrome   ? Thyroid nodule   ?  ?Past Surgical History:  ?Procedure Laterality Date  ? NEPHRECTOMY    ? 11/20/16  ? ROBOT ASSISTED LAPAROSCOPIC NEPHRECTOMY Right 11/20/2016  ? Procedure: XI ROBOTIC ASSISTED LAPAROSCOPIC NEPHRECTOMY AND  TOTAL ADRENALECTOMY;  Surgeon: Alexis Frock, MD;  Location: [facility];  Service: Urology;  Laterality: Right;  ? TOTAL ABDOMINAL HYSTERECTOMY    ? TUBAL LIGATION    ?  ?Social History  ? ?Socioeconomic History  ? Marital status: Widowed  ?  Spouse name: Not on file  ? Number of children: Not on file  ? Years of education: Not on file  ? Highest education level: Not on file  ?Occupational History  ? Occupation: Chef  ?  Comment: [employer]  ?Tobacco Use  ? Smoking status: Never  ? Smokeless tobacco: Never  ?Vaping Use  ? Vaping Use: Never used  ?Substance and Sexual Activity  ? Alcohol use: No  ? Drug use: No  ? Sexual activity: Yes  ?Other Topics Concern  ? Not on file  ?Social History Narrative  ? Not on file  ? ?Social Determinants of Health  ? ?Financial Resource Strain: Not on file  ?Food Insecurity: Not on file  ?Transportation Needs: Not on file  ?Physical Activity: Not on file  ?Stress: Not on file  ?Social Connections: Not on file  ?Intimate Partner Violence: Not on file  ?  ? ?   ?Objective:  ?  ?BP 136/80   Pulse 99   Temp 97.9 ?F (36.6 ?C) (Temporal)   Ht 5' 9.5"  (1.765 m)   Wt 252 lb 9.6 oz (114.6 kg)   SpO2 95%   BMI 36.77 kg/m?  ? ?Wt Readings from Last 3 Encounters:  ?10/02/21 252 lb 9.6 oz (114.6 kg)  ?09/17/21 248 lb (112.5 kg)  ?08/08/21 248 lb 3.2 oz (112.6 kg)  ? ? ?Physical Exam ?Vitals reviewed.  ?Constitutional:   ?   General: She is not in acute distress. ?   Appearance: Normal appearance. She is obese. She is not ill-appearing, toxic-appearing or diaphoretic.  ?HENT:  ?   Head: Normocephalic and atraumatic.  ?Eyes:  ?   General: No scleral icterus.    ?   Right eye: No discharge.     ?   Left eye: No discharge.  ?   Conjunctiva/sclera: Conjunctivae normal.  ?Cardiovascular:  ?   Rate and Rhythm: Normal rate and regular rhythm.  ?   Heart sounds: Normal heart sounds. No murmur heard. ?  No friction rub. No gallop.  ?Pulmonary:  ?   Effort: Pulmonary effort is normal. No respiratory distress.  ?   Breath sounds: Normal breath sounds. No stridor. No wheezing, rhonchi or rales.  ?Musculoskeletal:     ?   General: Normal range of motion.  ?   Cervical back: Normal range of motion.  ?Skin: ?   General: Skin is warm and dry.  ?   Capillary Refill: Capillary refill takes less than 2 seconds.  ?Neurological:  ?   General: No focal deficit present.  ?   Mental Status: She is alert and oriented to person, place, and time. Mental status is at baseline.  ?Psychiatric:     ?   Mood and Affect: Mood normal.     ?   Behavior: Behavior normal.     ?   Thought Content: Thought content normal.     ?   Judgment: Judgment normal.  ? ? ?Lab Results  ?Component Value Date  ? TSH 0.920 02/16/2021  ? ?Lab Results  ?Component Value Date  ? WBC 10.7 06/27/2021  ? HGB 12.9 06/27/2021  ? HCT 39.8 06/27/2021  ? MCV 84 06/27/2021  ? PLT 277 06/27/2021  ? ?Lab Results  ?Component Value Date  ? NA 141 06/27/2021  ? K 4.4 06/27/2021  ? CO2 25 06/27/2021  ? GLUCOSE 80 06/27/2021  ? BUN 14 06/27/2021  ? CREATININE 0.98 06/27/2021  ? BILITOT 0.3 06/27/2021  ? ALKPHOS 90 06/27/2021  ? AST 16  06/27/2021  ? ALT 18 06/27/2021  ? PROT 7.6 06/27/2021  ? ALBUMIN 4.5 06/27/2021  ? CALCIUM 9.6 06/27/2021  ? ANIONGAP 10 01/04/2017  ? EGFR 66 06/27/2021  ? ?Lab  Results  ?Component Value Date  ? CHOL 193 06/27/2021  ? ?Lab Results  ?Component Value Date  ? HDL 49 06/27/2021  ? ?Lab Results  ?Component Value Date  ? LDLCALC 124 (H) 06/27/2021  ? ?Lab Results  ?Component Value Date  ? TRIG 112 06/27/2021  ? ?Lab Results  ?Component Value Date  ? CHOLHDL 3.9 06/27/2021  ? ?No results found for: HGBA1C ? ?   ? ? ? ? ?

## 2021-10-03 LAB — LIPID PANEL
Chol/HDL Ratio: 3.6 ratio (ref 0.0–4.4)
Cholesterol, Total: 162 mg/dL (ref 100–199)
HDL: 45 mg/dL (ref >39)
LDL Chol Calc (NIH): 88 mg/dL (ref 0–99)
Triglycerides: 171 mg/dL — ABNORMAL HIGH (ref 0–149)
VLDL Cholesterol Cal: 29 mg/dL (ref 5–40)

## 2021-10-03 LAB — CMP14+EGFR
ALT: 16 IU/L (ref 0–32)
AST: 18 IU/L (ref 0–40)
Albumin/Globulin Ratio: 1.4 (ref 1.2–2.2)
Albumin: 4.3 g/dL (ref 3.8–4.8)
Alkaline Phosphatase: 91 IU/L (ref 44–121)
BUN/Creatinine Ratio: 22 (ref 12–28)
BUN: 20 mg/dL (ref 8–27)
Bilirubin Total: <0.2 mg/dL (ref 0.0–1.2)
CO2: 25 mmol/L (ref 20–29)
Calcium: 9.2 mg/dL (ref 8.7–10.3)
Chloride: 103 mmol/L (ref 96–106)
Creatinine, Ser: 0.92 mg/dL (ref 0.57–1.00)
Globulin, Total: 3 g/dL (ref 1.5–4.5)
Glucose: 111 mg/dL — ABNORMAL HIGH (ref 70–99)
Potassium: 4.4 mmol/L (ref 3.5–5.2)
Sodium: 141 mmol/L (ref 134–144)
Total Protein: 7.3 g/dL (ref 6.0–8.5)
eGFR: 71 mL/min/{1.73_m2} (ref >59)

## 2021-10-15 ENCOUNTER — Other Ambulatory Visit: Payer: Self-pay | Admitting: Family Medicine

## 2021-10-15 DIAGNOSIS — E782 Mixed hyperlipidemia: Secondary | ICD-10-CM

## 2021-11-05 ENCOUNTER — Other Ambulatory Visit: Payer: Self-pay | Admitting: Family Medicine

## 2021-11-05 DIAGNOSIS — I10 Essential (primary) hypertension: Secondary | ICD-10-CM

## 2022-03-06 ENCOUNTER — Encounter: Payer: Self-pay | Admitting: *Deleted

## 2022-04-23 ENCOUNTER — Other Ambulatory Visit: Payer: Self-pay | Admitting: Family Medicine

## 2022-04-23 DIAGNOSIS — I10 Essential (primary) hypertension: Secondary | ICD-10-CM

## 2022-05-07 ENCOUNTER — Other Ambulatory Visit: Payer: Self-pay | Admitting: Family Medicine

## 2022-05-07 DIAGNOSIS — I10 Essential (primary) hypertension: Secondary | ICD-10-CM

## 2022-07-02 ENCOUNTER — Encounter: Payer: 59 | Admitting: Nurse Practitioner

## 2022-08-07 ENCOUNTER — Telehealth: Payer: Self-pay | Admitting: Family Medicine

## 2022-08-07 DIAGNOSIS — E782 Mixed hyperlipidemia: Secondary | ICD-10-CM

## 2022-08-07 DIAGNOSIS — G479 Sleep disorder, unspecified: Secondary | ICD-10-CM

## 2022-08-07 DIAGNOSIS — I10 Essential (primary) hypertension: Secondary | ICD-10-CM

## 2022-08-07 MED ORDER — AMLODIPINE BESYLATE 10 MG PO TABS: 10.0000 mg | ORAL_TABLET | Freq: Every day | ORAL | 0 refills | Status: DC

## 2022-08-07 MED ORDER — TRAZODONE HCL 50 MG PO TABS: 50.0000 mg | ORAL_TABLET | Freq: Every evening | ORAL | 0 refills | Status: DC | PRN

## 2022-08-07 MED ORDER — ROSUVASTATIN CALCIUM 5 MG PO TABS: 5.0000 mg | ORAL_TABLET | Freq: Every day | ORAL | 0 refills | Status: DC

## 2022-08-07 NOTE — Addendum Note (Signed)
Addended by: Alphonzo Dublin on: 08/07/2022 04:39 PM   Modules accepted: Orders

## 2022-08-07 NOTE — Telephone Encounter (Signed)
  Prescription Request  08/07/2022  Is this a "Controlled Substance" medicine? no  Have you seen your PCP in the last 2 weeks? no  If YES, route message to pool  -  If NO, patient needs to be scheduled for appointment.  What is the name of the medication or equipment? Amolodipine '10mg'$   Have you contacted your pharmacy to request a refill? no   Which pharmacy would you like this sent to? [facility]   Patient notified that their request is being sent to the clinical staff for review and that they should receive a response within 2 business days.    Je's pt  Pt has an appt w/TM in April  Pt only has 2 pills left.

## 2022-08-07 NOTE — Telephone Encounter (Signed)
Refills sent. Pt made aware.

## 2022-08-07 NOTE — Telephone Encounter (Signed)
Ok to refill amlodipine, crestor, and trazodone.

## 2022-08-07 NOTE — Telephone Encounter (Signed)
Tiffany please review for refills since you have not seen the pt yet. She does have an appt in April. Pt needs all medications refilled.

## 2022-08-14 ENCOUNTER — Encounter: Payer: 59 | Admitting: Nurse Practitioner

## 2022-09-19 ENCOUNTER — Encounter: Payer: Self-pay | Admitting: Radiology

## 2022-09-26 ENCOUNTER — Other Ambulatory Visit: Payer: Self-pay | Admitting: Family Medicine

## 2022-09-26 DIAGNOSIS — G479 Sleep disorder, unspecified: Secondary | ICD-10-CM

## 2022-10-09 ENCOUNTER — Other Ambulatory Visit: Payer: Self-pay | Admitting: Family Medicine

## 2022-10-09 DIAGNOSIS — G479 Sleep disorder, unspecified: Secondary | ICD-10-CM

## 2022-10-30 ENCOUNTER — Encounter: Payer: 59 | Admitting: Family Medicine

## 2022-11-15 ENCOUNTER — Other Ambulatory Visit: Payer: Self-pay | Admitting: Family Medicine

## 2022-11-15 DIAGNOSIS — E782 Mixed hyperlipidemia: Secondary | ICD-10-CM

## 2022-11-15 NOTE — Telephone Encounter (Signed)
Patient NTBS she has not been seen in over a year.

## 2022-11-15 NOTE — Telephone Encounter (Signed)
Pt has scheduled apt in July for CPE

## 2022-11-28 ENCOUNTER — Encounter: Payer: Self-pay | Admitting: Family Medicine

## 2023-01-27 NOTE — Patient Instructions (Addendum)
Our records indicate that you are due for your annual mammogram/breast imaging. While there is no way to prevent breast cancer, early detection provides the best opportunity for curing it. For women over the age of 40, the American Cancer Society recommends a yearly clinical breast exam and a yearly mammogram. These practices have saved thousands of lives. We need your help to ensure that you are receiving optimal medical care. Please call the imaging location that has done you previous mammograms. Please remember to list us as your primary care. This helps make sure we receive a report and can update your chart.  Below is the contact information for several local breast imaging centers. You may call the location that works best for you, and they will be happy to assistance in making you an appointment. You do not need an order for a regular screening mammogram. However, if you are having any problems or concerns with you breast area, please let your primary care provider know, and appropriate orders will be placed. Please let our office know if you have any questions or concerns. Or if you need information for another imaging center not on this list or outside of the area. We are commented to working with you on your health care journey.   The mobile unit/bus ([facility]) - they come twice a month to our location.  These appointments can be made through our office or by call [facility]  [facility]  [address] [place], [place] [address] Phone [phone]  [facility] Radiology Department  [address]  [place], [place] [address] [phone]  [facility])  [address] [place], [place] [address] [phone]  [facility] [place]  [address] [place] [address] [phone]  [facility]  [address], [address] [place] [address] [phone]  [facility] [place]  [address] [place], [place] [address] [phone]  [facility] [address] [place], [place] [address] [phone]  [facility] [address] [place], [place] [address] [phone]  [facility] [address] [place] [address] [phone]    Health Maintenance, Female Adopting a healthy lifestyle and getting preventive care are important in promoting health and wellness. Ask your health care provider about: The right schedule for you to have regular tests and exams. Things you can do on your own to prevent diseases and keep yourself healthy. What should I know about diet, weight, and exercise? Eat a healthy diet  Eat a diet that includes plenty of vegetables, fruits, low-fat dairy products, and lean protein. Do not eat a lot of foods that are high in solid fats, added sugars, or sodium. Maintain a healthy weight Body mass index (BMI) is used to identify weight problems. It estimates body fat based on height and weight. Your health care provider can help determine your BMI and help you achieve or maintain a healthy weight. Get regular exercise Get regular exercise. This is one of the most important things you can do for your health. Most adults should: Exercise for at least 150 minutes each week. The exercise should increase your heart rate and make you sweat (moderate-intensity exercise). Do   strengthening exercises at least twice a week. This is in addition to the moderate-intensity exercise. Spend less time sitting. Even light physical activity can be beneficial. Watch cholesterol and blood lipids Have your blood tested for lipids and cholesterol at 62 years of age, then have this test every 5 years. Have your cholesterol levels checked  more often if: Your lipid or cholesterol levels are high. You are older than 62 years of age. You are at high risk for heart disease. What should I know about cancer screening? Depending on your health history and family history, you may need to have cancer screening at various ages. This may include screening for: Breast cancer. Cervical cancer. Colorectal cancer. Skin cancer. Lung cancer. What should I know about heart disease, diabetes, and high blood pressure? Blood pressure and heart disease High blood pressure causes heart disease and increases the risk of stroke. This is more likely to develop in people who have high blood pressure readings or are overweight. Have your blood pressure checked: Every 3-5 years if you are 18-39 years of age. Every year if you are 40 years old or older. Diabetes Have regular diabetes screenings. This checks your fasting blood sugar level. Have the screening done: Once every three years after age 40 if you are at a normal weight and have a low risk for diabetes. More often and at a younger age if you are overweight or have a high risk for diabetes. What should I know about preventing infection? Hepatitis B If you have a higher risk for hepatitis B, you should be screened for this virus. Talk with your health care provider to find out if you are at risk for hepatitis B infection. Hepatitis C Testing is recommended for: Everyone born from 1945 through 1965. Anyone with known risk factors for hepatitis C. Sexually transmitted infections (STIs) Get screened for STIs, including gonorrhea and chlamydia, if: You are sexually active and are younger than 62 years of age. You are older than 62 years of age and your health care provider tells you that you are at risk for this type of infection. Your sexual activity has changed since you were last screened, and you are at increased risk for chlamydia or gonorrhea. Ask your health care provider if you are at  risk. Ask your health care provider about whether you are at high risk for HIV. Your health care provider may recommend a prescription medicine to help prevent HIV infection. If you choose to take medicine to prevent HIV, you should first get tested for HIV. You should then be tested every 3 months for as long as you are taking the medicine. Pregnancy If you are about to stop having your period (premenopausal) and you may become pregnant, seek counseling before you get pregnant. Take 400 to 800 micrograms (mcg) of folic acid every day if you become pregnant. Ask for birth control (contraception) if you want to prevent pregnancy. Osteoporosis and menopause Osteoporosis is a disease in which the bones lose minerals and strength with aging. This can result in bone fractures. If you are 65 years old or older, or if you are at risk for osteoporosis and fractures, ask your health care provider if you should: Be screened for bone loss. Take a calcium or vitamin D supplement to lower your risk of fractures. Be given hormone replacement therapy (HRT) to treat symptoms of menopause. Follow these instructions at home: Alcohol use Do not drink alcohol if: Your health care provider tells you not to   drink. You are pregnant, may be pregnant, or are planning to become pregnant. If you drink alcohol: Limit how much you have to: 0-1 drink a day. Know how much alcohol is in your drink. In the [place] one drink equals one 12 oz bottle of beer (355 mL), one 5 oz glass of wine (148 mL), or one 1 oz glass of hard liquor (44 mL). Lifestyle Do not use any products that contain nicotine or tobacco. These products include cigarettes, chewing tobacco, and vaping devices, such as e-cigarettes. If you need help quitting, ask your health care provider. Do not use street drugs. Do not share needles. Ask your health care provider for help if you need support or information about quitting drugs. General instructions Schedule  regular health, dental, and eye exams. Stay current with your vaccines. Tell your health care provider if: You often feel depressed. You have ever been abused or do not feel safe at home. Summary Adopting a healthy lifestyle and getting preventive care are important in promoting health and wellness. Follow your health care provider's instructions about healthy diet, exercising, and getting tested or screened for diseases. Follow your health care provider's instructions on monitoring your cholesterol and blood pressure. This information is not intended to replace advice given to you by your health care provider. Make sure you discuss any questions you have with your health care provider. Document Revised: 11/27/2020 Document Reviewed: 11/27/2020 Elsevier Patient Education  2024 Elsevier Inc.  

## 2023-01-31 ENCOUNTER — Ambulatory Visit (INDEPENDENT_AMBULATORY_CARE_PROVIDER_SITE_OTHER): Payer: 59 | Admitting: Family Medicine

## 2023-01-31 ENCOUNTER — Encounter: Payer: Self-pay | Admitting: Family Medicine

## 2023-01-31 VITALS — BP 136/89 | HR 96 | Temp 97.8°F | Resp 20 | Ht 69.5 in | Wt 243.0 lb

## 2023-01-31 DIAGNOSIS — D509 Iron deficiency anemia, unspecified: Secondary | ICD-10-CM | POA: Diagnosis not present

## 2023-01-31 DIAGNOSIS — I10 Essential (primary) hypertension: Secondary | ICD-10-CM

## 2023-01-31 DIAGNOSIS — Z0001 Encounter for general adult medical examination with abnormal findings: Secondary | ICD-10-CM | POA: Diagnosis not present

## 2023-01-31 DIAGNOSIS — G8929 Other chronic pain: Secondary | ICD-10-CM | POA: Diagnosis not present

## 2023-01-31 DIAGNOSIS — E782 Mixed hyperlipidemia: Secondary | ICD-10-CM

## 2023-01-31 DIAGNOSIS — M25562 Pain in left knee: Secondary | ICD-10-CM

## 2023-01-31 DIAGNOSIS — Z0000 Encounter for general adult medical examination without abnormal findings: Secondary | ICD-10-CM

## 2023-01-31 LAB — BAYER DCA HB A1C WAIVED: HB A1C (BAYER DCA - WAIVED): 5.6 % (ref 4.8–5.6)

## 2023-01-31 MED ORDER — ROSUVASTATIN CALCIUM 5 MG PO TABS
5.0000 mg | ORAL_TABLET | Freq: Every day | ORAL | 3 refills | Status: DC
Start: 2023-01-31 — End: 2023-02-03

## 2023-01-31 MED ORDER — AMLODIPINE BESYLATE 10 MG PO TABS
10.0000 mg | ORAL_TABLET | Freq: Every day | ORAL | 3 refills | Status: DC
Start: 2023-01-31 — End: 2024-02-23

## 2023-01-31 NOTE — Progress Notes (Signed)
Complete physical exam  Patient: Jordan Harmon   DOB: [birth year]   62 y.o. Female  MRN: [id number]  Subjective:    Chief Complaint  Patient presents with   Annual Exam    Jordan Harmon is a 62 y.o. female who presents today for a complete physical exam. She reports consuming a fairly well balanced diet. Home exercise routine includes walking 0.5-1 hrs per 3x a week. She generally feels well. She reports sleeping well. She does have additional problems to discuss today.   Chronic knee pain, worse x 3 months. Lateral anterior. She reports intermittent achy pain. The pain is worse with activity, standing, walking. She also endorses popping, clicking, and catching. Also has stiffness. She has been using a brace, tylenol, advil, epsom salt soaks with some temporary relief. No injury.    Most recent fall risk assessment:    01/31/2023    2:20 PM  Fall Risk   Falls in the past year? 0  Follow up Falls evaluation completed     Most recent depression screenings:    01/31/2023    2:21 PM 10/02/2021    3:52 PM  PHQ 2/9 Scores  PHQ - 2 Score 0 0  PHQ- 9 Score 0 0    Vision:Within last year and Dental: No current dental problems and No regular dental care   Past Medical History:  Diagnosis Date   Adrenal nodule (HCC)    h/o adrenalectomy   Anemia    Asthma    as child   Depression    Hyperlipidemia 02/17/2021   Hypertension    Liver cyst    benign hemangioma   Obesity    Pulmonary nodule    benign work-up   Renal mass    renal cell carcinoma - h/o nephrectomy   Restless leg syndrome    Thyroid nodule       Patient Care Team: Jordan Earing, FNP as PCP - General (Family Medicine)   Outpatient Medications Prior to Visit  Medication Sig   amLODipine (NORVASC) 10 MG tablet Take 1 tablet (10 mg total) by mouth daily.   rosuvastatin (CRESTOR) 5 MG tablet Take 1 tablet (5 mg total) by mouth daily.   traZODone (DESYREL) 50 MG tablet TAKE 50-100 MG AS NEEDED FOR  SLEEP. NEEDS APPOINTMENT FOR FURTHER REFILLS.   No facility-administered medications prior to visit.    ROS Negative unless specially indicated above in HPI.     Objective:     BP 136/89   Pulse 96   Temp 97.8 F (36.6 C) (Oral)   Resp 20   Ht 5' 9.5" (1.765 m)   Wt 243 lb (110.2 kg)   SpO2 95%   BMI 35.37 kg/m    Physical Exam Vitals and nursing note reviewed.  Constitutional:      General: She is not in acute distress.    Appearance: She is obese. She is not ill-appearing, toxic-appearing or diaphoretic.  HENT:     Head: Normocephalic.     Right Ear: Tympanic membrane, ear canal and external ear normal.     Left Ear: Tympanic membrane, ear canal and external ear normal.     Nose: Nose normal.     Mouth/Throat:     Mouth: Mucous membranes are dry.     Pharynx: Oropharynx is clear.  Eyes:     Extraocular Movements: Extraocular movements intact.     Conjunctiva/sclera: Conjunctivae normal.     Pupils: Pupils are equal, round, and  reactive to light.  Cardiovascular:     Rate and Rhythm: Normal rate and regular rhythm.     Pulses: Normal pulses.     Heart sounds: Normal heart sounds. No murmur heard.    No friction rub. No gallop.  Pulmonary:     Effort: Pulmonary effort is normal.     Breath sounds: Normal breath sounds.  Abdominal:     General: Bowel sounds are normal. There is no distension.     Palpations: Abdomen is soft. There is no mass.     Tenderness: There is no abdominal tenderness. There is no guarding.  Musculoskeletal:     Cervical back: Normal range of motion and neck supple. No tenderness.     Left knee: No swelling, deformity, effusion, erythema or bony tenderness. Normal range of motion. Tenderness present over the medial joint line. No patellar tendon tenderness.     Instability Tests: Medial McMurray test negative and lateral McMurray test negative.     Right lower leg: No edema.     Left lower leg: No edema.  Skin:    General: Skin is warm  and dry.     Capillary Refill: Capillary refill takes less than 2 seconds.     Findings: No lesion or rash.  Neurological:     General: No focal deficit present.     Mental Status: She is alert and oriented to person, place, and time.     Cranial Nerves: No cranial nerve deficit.     Motor: No weakness.     Coordination: Coordination normal.     Gait: Gait normal.  Psychiatric:        Mood and Affect: Mood normal.        Behavior: Behavior normal.        Thought Content: Thought content normal.        Judgment: Judgment normal.      No results found for any visits on 01/31/23.     Assessment & Plan:    Routine Health Maintenance and Physical Exam  Jordan Harmon was seen today for annual exam.  Diagnoses and all orders for this visit:  Routine general medical examination at a health care facility  Primary hypertension Well controlled on current regimen. Labs pending.  -     CMP14+EGFR -     Lipid panel -     TSH -     amLODipine (NORVASC) 10 MG tablet; Take 1 tablet (10 mg total) by mouth daily.  Mixed hyperlipidemia On statin. Labs pending.  -     CMP14+EGFR -     Lipid panel -     rosuvastatin (CRESTOR) 5 MG tablet; Take 1 tablet (5 mg total) by mouth daily.  Iron deficiency anemia, unspecified iron deficiency anemia type -     Anemia Profile B  Morbid obesity due to excess calories (HCC) BMI 35 with HTN, HLD. Diet and exercise. Labs pending.  -     CMP14+EGFR -     Lipid panel -     TSH -     VITAMIN D 25 Hydroxy (Vit-D Deficiency, Fractures) -     Bayer DCA Hb A1c Waived  Chronic pain of left knee Reviewed xray from 2023 with mild arthritis and quadricept tendonitis. Referral to ortho as below.  -     Ambulatory referral to Orthopedic Surgery   Immunization History  Administered Date(s) Administered   Influenza-Unspecified 05/16/2021   Moderna Sars-Covid-2 Vaccination 11/09/2019, 11/14/2019, 12/07/2019, 12/10/2020   Tdap 11/02/2013  Health Maintenance   Topic Date Due   Colonoscopy  Never done   MAMMOGRAM  Never done   COVID-19 Vaccine (5 - 2023-24 season) 03/22/2022   Zoster Vaccines- Shingrix (1 of 2) 04/29/2023 (Originally 09/07/2010)   INFLUENZA VACCINE  02/20/2023   DTaP/Tdap/Td (2 - Td or Tdap) 11/03/2023   Hepatitis C Screening  Completed   HIV Screening  Completed   HPV VACCINES  Aged Out    Discussed health benefits of physical activity, and encouraged her to engage in regular exercise appropriate for her age and condition.  Problem List Items Addressed This Visit       Cardiovascular and Mediastinum   Primary hypertension   Relevant Medications   amLODipine (NORVASC) 10 MG tablet   rosuvastatin (CRESTOR) 5 MG tablet   Other Relevant Orders   CMP14+EGFR   Lipid panel   TSH     Other   Morbid obesity due to excess calories (HCC)   Relevant Orders   CMP14+EGFR   Lipid panel   TSH   VITAMIN D 25 Hydroxy (Vit-D Deficiency, Fractures)   Bayer DCA Hb A1c Waived   Iron deficiency anemia   Relevant Orders   Anemia Profile B   Mixed hyperlipidemia   Relevant Medications   amLODipine (NORVASC) 10 MG tablet   rosuvastatin (CRESTOR) 5 MG tablet   Other Relevant Orders   CMP14+EGFR   Lipid panel   Other Visit Diagnoses     Routine general medical examination at a health care facility    -  Primary   Chronic pain of left knee       Relevant Orders   Ambulatory referral to Orthopedic Surgery   Essential hypertension       Relevant Medications   amLODipine (NORVASC) 10 MG tablet   rosuvastatin (CRESTOR) 5 MG tablet      Return in about 6 months (around 08/03/2023) for chronic follow up.  The patient indicates understanding of these issues and agrees with the plan.   Jordan Earing, FNP

## 2023-02-01 LAB — ANEMIA PROFILE B
Basophils Absolute: 0.1 10*3/uL (ref 0.0–0.2)
Basos: 1 %
EOS (ABSOLUTE): 0.2 10*3/uL (ref 0.0–0.4)
Eos: 2 %
Ferritin: 93 ng/mL (ref 15–150)
Folate: 3.2 ng/mL (ref >3.0)
Hematocrit: 39.9 % (ref 34.0–46.6)
Hemoglobin: 13.1 g/dL (ref 11.1–15.9)
Immature Grans (Abs): 0.1 10*3/uL (ref 0.0–0.1)
Immature Granulocytes: 1 %
Iron Saturation: 17 % (ref 15–55)
Iron: 57 ug/dL (ref 27–139)
Lymphocytes Absolute: 2.3 10*3/uL (ref 0.7–3.1)
Lymphs: 23 %
MCH: 27.5 pg (ref 26.6–33.0)
MCHC: 32.8 g/dL (ref 31.5–35.7)
MCV: 84 fL (ref 79–97)
Monocytes Absolute: 0.6 10*3/uL (ref 0.1–0.9)
Monocytes: 6 %
Neutrophils Absolute: 7.1 10*3/uL — ABNORMAL HIGH (ref 1.4–7.0)
Neutrophils: 67 %
Platelets: 338 10*3/uL (ref 150–450)
RBC: 4.76 x10E6/uL (ref 3.77–5.28)
RDW: 14.1 % (ref 11.7–15.4)
Retic Ct Pct: 1.4 % (ref 0.6–2.6)
Total Iron Binding Capacity: 330 ug/dL (ref 250–450)
UIBC: 273 ug/dL (ref 118–369)
Vitamin B-12: 261 pg/mL (ref 232–1245)
WBC: 10.3 10*3/uL (ref 3.4–10.8)

## 2023-02-01 LAB — CMP14+EGFR
ALT: 15 IU/L (ref 0–32)
AST: 16 IU/L (ref 0–40)
Albumin: 4.1 g/dL (ref 3.9–4.9)
Alkaline Phosphatase: 89 IU/L (ref 44–121)
BUN/Creatinine Ratio: 11 — ABNORMAL LOW (ref 12–28)
BUN: 13 mg/dL (ref 8–27)
Bilirubin Total: 0.2 mg/dL (ref 0.0–1.2)
CO2: 24 mmol/L (ref 20–29)
Calcium: 9.3 mg/dL (ref 8.7–10.3)
Chloride: 104 mmol/L (ref 96–106)
Creatinine, Ser: 1.19 mg/dL — ABNORMAL HIGH (ref 0.57–1.00)
Globulin, Total: 3.2 g/dL (ref 1.5–4.5)
Glucose: 87 mg/dL (ref 70–99)
Potassium: 4.5 mmol/L (ref 3.5–5.2)
Sodium: 142 mmol/L (ref 134–144)
Total Protein: 7.3 g/dL (ref 6.0–8.5)
eGFR: 52 mL/min/{1.73_m2} — ABNORMAL LOW (ref >59)

## 2023-02-01 LAB — LIPID PANEL
Chol/HDL Ratio: 4.6 ratio — ABNORMAL HIGH (ref 0.0–4.4)
Cholesterol, Total: 192 mg/dL (ref 100–199)
HDL: 42 mg/dL (ref >39)
LDL Chol Calc (NIH): 119 mg/dL — ABNORMAL HIGH (ref 0–99)
Triglycerides: 173 mg/dL — ABNORMAL HIGH (ref 0–149)
VLDL Cholesterol Cal: 31 mg/dL (ref 5–40)

## 2023-02-01 LAB — TSH: TSH: 0.99 u[IU]/mL (ref 0.450–4.500)

## 2023-02-01 LAB — VITAMIN D 25 HYDROXY (VIT D DEFICIENCY, FRACTURES): Vit D, 25-Hydroxy: 13.9 ng/mL — ABNORMAL LOW (ref 30.0–100.0)

## 2023-02-03 ENCOUNTER — Other Ambulatory Visit: Payer: Self-pay | Admitting: Family Medicine

## 2023-02-03 DIAGNOSIS — E782 Mixed hyperlipidemia: Secondary | ICD-10-CM

## 2023-02-03 MED ORDER — ROSUVASTATIN CALCIUM 10 MG PO TABS
10.0000 mg | ORAL_TABLET | Freq: Every day | ORAL | 3 refills | Status: DC
Start: 2023-02-03 — End: 2023-09-22

## 2023-08-01 ENCOUNTER — Ambulatory Visit: Payer: 59 | Admitting: Family Medicine

## 2023-08-26 ENCOUNTER — Telehealth: Payer: Self-pay | Admitting: Family Medicine

## 2023-08-27 ENCOUNTER — Ambulatory Visit: Payer: Self-pay | Admitting: Family Medicine

## 2023-08-29 ENCOUNTER — Ambulatory Visit: Payer: Self-pay

## 2023-09-15 ENCOUNTER — Ambulatory Visit: Payer: Self-pay | Admitting: Family Medicine

## 2023-09-22 ENCOUNTER — Ambulatory Visit (INDEPENDENT_AMBULATORY_CARE_PROVIDER_SITE_OTHER): Payer: Self-pay | Admitting: Family Medicine

## 2023-09-22 ENCOUNTER — Encounter: Payer: Self-pay | Admitting: Family Medicine

## 2023-09-22 VITALS — BP 137/78 | HR 99 | Temp 97.8°F | Ht 69.5 in | Wt 236.6 lb

## 2023-09-22 DIAGNOSIS — I10 Essential (primary) hypertension: Secondary | ICD-10-CM

## 2023-09-22 DIAGNOSIS — E782 Mixed hyperlipidemia: Secondary | ICD-10-CM | POA: Diagnosis not present

## 2023-09-22 DIAGNOSIS — R944 Abnormal results of kidney function studies: Secondary | ICD-10-CM

## 2023-09-22 DIAGNOSIS — Z85528 Personal history of other malignant neoplasm of kidney: Secondary | ICD-10-CM | POA: Insufficient documentation

## 2023-09-22 DIAGNOSIS — Z6834 Body mass index (BMI) 34.0-34.9, adult: Secondary | ICD-10-CM

## 2023-09-22 DIAGNOSIS — E66811 Obesity, class 1: Secondary | ICD-10-CM | POA: Diagnosis not present

## 2023-09-22 DIAGNOSIS — Z905 Acquired absence of kidney: Secondary | ICD-10-CM | POA: Insufficient documentation

## 2023-09-22 DIAGNOSIS — E559 Vitamin D deficiency, unspecified: Secondary | ICD-10-CM

## 2023-09-22 MED ORDER — ROSUVASTATIN CALCIUM 10 MG PO TABS
10.0000 mg | ORAL_TABLET | Freq: Every day | ORAL | 3 refills | Status: DC
Start: 2023-09-22 — End: 2024-02-23

## 2023-09-22 NOTE — Progress Notes (Signed)
   Established Patient Office Visit  Subjective   Patient ID: Jordan Harmon, female    DOB: 12-26-1960  Age: 63 y.o. MRN: [id number]  Chief Complaint  Patient presents with   Medical Management of Chronic Issues    HPI Jordan Harmon is here for a chronic follow up. 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 She has enjoyed this.   She has been working to lose weight. She has been walking 3 miles after work. She has cut back on sweets, sodas as well. She has not been taking her cholesterol medication. She wasn't aware that this had been sent in. She is compliant with amlodipine. Denies chest pain, shortness of breath, edema, visual disturbances. She checks her BP at home- 120s/70-80s.     ROS Negative unless specially indicated above in HPI.    Objective:     BP 137/78   Pulse 99   Temp 97.8 F (36.6 C) (Temporal)   Ht 5' 9.5" (1.765 m)   Wt 236 lb 9.6 oz (107.3 kg)   SpO2 96%   BMI 34.44 kg/m  Wt Readings from Last 3 Encounters:  09/22/23 236 lb 9.6 oz (107.3 kg)  01/31/23 243 lb (110.2 kg)  10/02/21 252 lb 9.6 oz (114.6 kg)      Physical Exam Vitals and nursing note reviewed.  Constitutional:      General: She is not in acute distress.    Appearance: She is obese. She is not ill-appearing, toxic-appearing or diaphoretic.  Cardiovascular:     Rate and Rhythm: Normal rate and regular rhythm.     Heart sounds: Normal heart sounds. No murmur heard. Pulmonary:     Effort: Pulmonary effort is normal. No respiratory distress.     Breath sounds: Normal breath sounds. No wheezing, rhonchi or rales.  Musculoskeletal:     Right lower leg: No edema.     Left lower leg: No edema.  Skin:    General: Skin is warm and dry.  Neurological:     General: No focal deficit present.     Mental Status: She is alert and oriented to person, place, and time.  Psychiatric:        Mood and Affect: Mood normal.        Behavior: Behavior  normal.      No results found for any visits on 09/22/23.    The 10-year ASCVD risk score (Arnett DK, et al., 2019) is: 10.3%    Assessment & Plan:   [name] was seen today for medical management of chronic issues.  Diagnoses and all orders for this visit:  Primary hypertension Well controlled on current regimen.   Mixed hyperlipidemia Start crestor. Will repeat fasting labs at next appt.  -     rosuvastatin (CRESTOR) 10 MG tablet; Take 1 tablet (10 mg total) by mouth daily.  Class 1 obesity due to excess calories with serious comorbidity and body mass index (BMI) of 34.0 to 34.9 in adult Weight trending down. Continue diet and exercise.   Vitamin D deficiency Not currently on supplement. Recheck pending.  -     VITAMIN D 25 Hydroxy (Vit-D Deficiency, Fractures)  Decreased GFR -     BMP8+EGFR  Return in about 6 months (around 03/24/2024) for CPE with fasting labs.   The patient indicates understanding of these issues and agrees with the plan.  Gabriel Earing, FNP

## 2023-09-23 LAB — BMP8+EGFR
BUN/Creatinine Ratio: 15 (ref 12–28)
BUN: 17 mg/dL (ref 8–27)
CO2: 23 mmol/L (ref 20–29)
Calcium: 9.4 mg/dL (ref 8.7–10.3)
Chloride: 105 mmol/L (ref 96–106)
Creatinine, Ser: 1.1 mg/dL — ABNORMAL HIGH (ref 0.57–1.00)
Glucose: 89 mg/dL (ref 70–99)
Potassium: 4.5 mmol/L (ref 3.5–5.2)
Sodium: 144 mmol/L (ref 134–144)
eGFR: 56 mL/min/{1.73_m2} — ABNORMAL LOW (ref >59)

## 2023-09-23 LAB — VITAMIN D 25 HYDROXY (VIT D DEFICIENCY, FRACTURES): Vit D, 25-Hydroxy: 10.9 ng/mL — ABNORMAL LOW (ref 30.0–100.0)

## 2023-09-24 ENCOUNTER — Other Ambulatory Visit: Payer: Self-pay | Admitting: Family Medicine

## 2023-09-24 ENCOUNTER — Encounter: Payer: Self-pay | Admitting: Family Medicine

## 2023-09-24 DIAGNOSIS — E559 Vitamin D deficiency, unspecified: Secondary | ICD-10-CM

## 2023-09-24 MED ORDER — VITAMIN D (ERGOCALCIFEROL) 1.25 MG (50000 UNIT) PO CAPS
50000.0000 [IU] | ORAL_CAPSULE | ORAL | 0 refills | Status: AC
Start: 2023-09-24 — End: ?

## 2024-02-22 ENCOUNTER — Other Ambulatory Visit: Payer: Self-pay | Admitting: Family Medicine

## 2024-02-22 DIAGNOSIS — I10 Essential (primary) hypertension: Secondary | ICD-10-CM

## 2024-02-22 DIAGNOSIS — E782 Mixed hyperlipidemia: Secondary | ICD-10-CM

## 2024-03-24 ENCOUNTER — Encounter: Payer: Self-pay | Admitting: Family Medicine

## 2024-06-02 ENCOUNTER — Encounter: Payer: Self-pay | Admitting: Family Medicine

## 2024-06-06 ENCOUNTER — Other Ambulatory Visit: Payer: Self-pay | Admitting: *Deleted

## 2024-06-06 DIAGNOSIS — E782 Mixed hyperlipidemia: Secondary | ICD-10-CM

## 2024-06-06 DIAGNOSIS — I10 Essential (primary) hypertension: Secondary | ICD-10-CM

## 2024-06-30 ENCOUNTER — Encounter: Admitting: Family Medicine

## 2024-07-03 ENCOUNTER — Other Ambulatory Visit: Payer: Self-pay | Admitting: Family Medicine

## 2024-07-03 DIAGNOSIS — I10 Essential (primary) hypertension: Secondary | ICD-10-CM

## 2024-07-03 DIAGNOSIS — E782 Mixed hyperlipidemia: Secondary | ICD-10-CM

## 2024-09-09 ENCOUNTER — Encounter: Admitting: Family Medicine
